# Patient Record
Sex: Male | Born: 1965 | Race: White | Hispanic: No | Smoking: Never smoker
Health system: Southern US, Community
[De-identification: ages and names within clinical notes are randomized; demographics above are authoritative.]

## PROBLEM LIST (undated history)

## (undated) DIAGNOSIS — M1990 Unspecified osteoarthritis, unspecified site: Secondary | ICD-10-CM

## (undated) DIAGNOSIS — E119 Type 2 diabetes mellitus without complications: Secondary | ICD-10-CM

## (undated) DIAGNOSIS — T7840XA Allergy, unspecified, initial encounter: Secondary | ICD-10-CM

## (undated) HISTORY — PX: SPINE SURGERY: SHX786

## (undated) HISTORY — DX: Allergy, unspecified, initial encounter: T78.40XA

## (undated) HISTORY — DX: Unspecified osteoarthritis, unspecified site: M19.90

## (undated) HISTORY — PX: BACK SURGERY: SHX140

## (undated) HISTORY — PX: KNEE ARTHROSCOPY: SUR90

---

## 1999-08-29 ENCOUNTER — Encounter: Payer: Self-pay | Admitting: Emergency Medicine

## 1999-08-29 ENCOUNTER — Emergency Department (HOSPITAL_COMMUNITY): Admission: EM | Admit: 1999-08-29 | Discharge: 1999-08-29 | Payer: Self-pay | Admitting: Emergency Medicine

## 2014-04-28 ENCOUNTER — Emergency Department (HOSPITAL_BASED_OUTPATIENT_CLINIC_OR_DEPARTMENT_OTHER): Payer: No Typology Code available for payment source

## 2014-04-28 ENCOUNTER — Emergency Department (HOSPITAL_BASED_OUTPATIENT_CLINIC_OR_DEPARTMENT_OTHER)
Admission: EM | Admit: 2014-04-28 | Discharge: 2014-04-28 | Disposition: A | Discharge status: Home / Self Care | Payer: No Typology Code available for payment source | Attending: Emergency Medicine | Admitting: Emergency Medicine

## 2014-04-28 ENCOUNTER — Encounter (HOSPITAL_BASED_OUTPATIENT_CLINIC_OR_DEPARTMENT_OTHER): Payer: Self-pay | Admitting: Emergency Medicine

## 2014-04-28 DIAGNOSIS — Z791 Long term (current) use of non-steroidal anti-inflammatories (NSAID): Secondary | ICD-10-CM | POA: Insufficient documentation

## 2014-04-28 DIAGNOSIS — Z9889 Other specified postprocedural states: Secondary | ICD-10-CM | POA: Insufficient documentation

## 2014-04-28 DIAGNOSIS — Y9389 Activity, other specified: Secondary | ICD-10-CM | POA: Insufficient documentation

## 2014-04-28 DIAGNOSIS — S46909A Unspecified injury of unspecified muscle, fascia and tendon at shoulder and upper arm level, unspecified arm, initial encounter: Secondary | ICD-10-CM | POA: Insufficient documentation

## 2014-04-28 DIAGNOSIS — Y92410 Unspecified street and highway as the place of occurrence of the external cause: Secondary | ICD-10-CM | POA: Insufficient documentation

## 2014-04-28 DIAGNOSIS — Z880 Allergy status to penicillin: Secondary | ICD-10-CM | POA: Insufficient documentation

## 2014-04-28 DIAGNOSIS — S339XXA Sprain of unspecified parts of lumbar spine and pelvis, initial encounter: Secondary | ICD-10-CM

## 2014-04-28 DIAGNOSIS — S4980XA Other specified injuries of shoulder and upper arm, unspecified arm, initial encounter: Secondary | ICD-10-CM | POA: Insufficient documentation

## 2014-04-28 DIAGNOSIS — M25519 Pain in unspecified shoulder: Secondary | ICD-10-CM

## 2014-04-28 DIAGNOSIS — S335XXA Sprain of ligaments of lumbar spine, initial encounter: Secondary | ICD-10-CM

## 2014-04-28 MED ORDER — HYDROCODONE-ACETAMINOPHEN 5-325 MG PO TABS: 1.0000 | ORAL_TABLET | Freq: Four times a day (QID) | ORAL | Status: DC | PRN

## 2014-04-28 MED ORDER — IBUPROFEN 600 MG PO TABS: 600.0000 mg | ORAL_TABLET | Freq: Four times a day (QID) | ORAL | Status: AC | PRN

## 2014-04-28 MED ORDER — HYDROCODONE-ACETAMINOPHEN 5-325 MG PO TABS
1.0000 | ORAL_TABLET | Freq: Once | ORAL | Status: AC
Start: 2014-04-28 — End: 2014-04-28
  Administered 2014-04-28: 1 via ORAL
  Filled 2014-04-28: qty 1
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
## 2015-06-30 ENCOUNTER — Ambulatory Visit (INDEPENDENT_AMBULATORY_CARE_PROVIDER_SITE_OTHER): Payer: BLUE CROSS/BLUE SHIELD | Admitting: Family Medicine

## 2015-06-30 VITALS — BP 138/82 | HR 93 | Temp 97.9°F | Resp 18 | Ht 71.75 in | Wt 223.2 lb

## 2015-06-30 DIAGNOSIS — Z0000 Encounter for general adult medical examination without abnormal findings: Secondary | ICD-10-CM | POA: Diagnosis not present

## 2015-06-30 DIAGNOSIS — Z13220 Encounter for screening for lipoid disorders: Secondary | ICD-10-CM | POA: Diagnosis not present

## 2015-06-30 DIAGNOSIS — E1165 Type 2 diabetes mellitus with hyperglycemia: Secondary | ICD-10-CM

## 2015-06-30 DIAGNOSIS — Z130 Encounter for screening for diseases of the blood and blood-forming organs and certain disorders involving the immune mechanism: Secondary | ICD-10-CM | POA: Diagnosis not present

## 2015-06-30 DIAGNOSIS — IMO0002 Reserved for concepts with insufficient information to code with codable children: Secondary | ICD-10-CM

## 2015-06-30 DIAGNOSIS — Z91030 Bee allergy status: Secondary | ICD-10-CM | POA: Diagnosis not present

## 2015-06-30 DIAGNOSIS — E663 Overweight: Secondary | ICD-10-CM

## 2015-06-30 DIAGNOSIS — Z23 Encounter for immunization: Secondary | ICD-10-CM | POA: Diagnosis not present

## 2015-06-30 DIAGNOSIS — Z131 Encounter for screening for diabetes mellitus: Secondary | ICD-10-CM | POA: Diagnosis not present

## 2015-06-30 DIAGNOSIS — E785 Hyperlipidemia, unspecified: Secondary | ICD-10-CM

## 2015-06-30 LAB — COMPREHENSIVE METABOLIC PANEL
ALK PHOS: 114 U/L (ref 39–117)
ALT: 27 U/L (ref 0–53)
AST: 16 U/L (ref 0–37)
Albumin: 4.4 g/dL (ref 3.5–5.2)
BUN: 15 mg/dL (ref 6–23)
CHLORIDE: 98 meq/L (ref 96–112)
CO2: 25 mEq/L (ref 19–32)
CREATININE: 0.87 mg/dL (ref 0.50–1.35)
Calcium: 9.6 mg/dL (ref 8.4–10.5)
Glucose, Bld: 378 mg/dL — ABNORMAL HIGH (ref 70–99)
POTASSIUM: 4.9 meq/L (ref 3.5–5.3)
SODIUM: 135 meq/L (ref 135–145)
TOTAL PROTEIN: 7.8 g/dL (ref 6.0–8.3)
Total Bilirubin: 0.4 mg/dL (ref 0.2–1.2)

## 2015-06-30 LAB — LIPID PANEL
Cholesterol: 184 mg/dL (ref 0–200)
HDL: 33 mg/dL — AB (ref >=40)
LDL CALC: 115 mg/dL — AB (ref 0–99)
Total CHOL/HDL Ratio: 5.6 Ratio
Triglycerides: 180 mg/dL — ABNORMAL HIGH (ref <150)
VLDL: 36 mg/dL (ref 0–40)

## 2015-06-30 LAB — CBC
HCT: 42.9 % (ref 39.0–52.0)
Hemoglobin: 14.6 g/dL (ref 13.0–17.0)
MCH: 29.4 pg (ref 26.0–34.0)
MCHC: 34 g/dL (ref 30.0–36.0)
MCV: 86.5 fL (ref 78.0–100.0)
MPV: 11.8 fL (ref 8.6–12.4)
Platelets: 212 10*3/uL (ref 150–400)
RBC: 4.96 MIL/uL (ref 4.22–5.81)
RDW: 12.1 % (ref 11.5–15.5)
WBC: 6.5 10*3/uL (ref 4.0–10.5)

## 2015-06-30 LAB — HEMOGLOBIN A1C
Hgb A1c MFr Bld: 11.7 % — ABNORMAL HIGH (ref <5.7)
Mean Plasma Glucose: 289 mg/dL — ABNORMAL HIGH (ref <117)

## 2015-06-30 MED ORDER — EPINEPHRINE 0.3 MG/0.3ML IJ SOAJ: 0.3000 mg | Freq: Once | INTRAMUSCULAR | Status: AC

## 2015-06-30 NOTE — Progress Notes (Addendum)
[facility] [address] [place] [address] [phone]  Date:  06/30/2015   Name:  Evan Wilkins   DOB:  [birth year]   MRN:  [id number]  PCP:  PROVIDER NOT IN SYSTEM    Chief Complaint: Annual Exam   History of Present Illness:  Evan Wilkins is a 49 y.o. very pleasant male patient who presents with the following:  Needs a CPE .  He is generally in good health He has had some knee operations, and a spinal fusion L4- S1.   Never a smoker He does exercise- goes to the gym, enjoys lifting weights and cardio He works in Air[employer].  He needs an epipen - bee allergy He is fasting today for labs  He thinks he needs a tetanus shot  He is divorced, has 2 grown daughters and a 57 year old son at home  There are no active problems to display for this patient.   Past Medical History  Diagnosis Date  . Allergy   . Arthritis     Past Surgical History  Procedure Laterality Date  . Back surgery    . Knee arthroscopy    . Spine surgery      History  Substance Use Topics  . Smoking status: Never Smoker   . Smokeless tobacco: Not on file  . Alcohol Use: No    Family History  Problem Relation Age of Onset  . Diabetes Father   . Heart disease Father   . Hypertension Father     Allergies  Allergen Reactions  . Amoxicillin     Medication list has been reviewed and updated.  Current Outpatient Prescriptions on File Prior to Visit  Medication Sig Dispense Refill  . ibuprofen (ADVIL,MOTRIN) 600 MG tablet Take 1 tablet (600 mg total) by mouth every 6 (six) hours as needed. 30 tablet 0   No current facility-administered medications on file prior to visit.    Review of Systems:  As per HPI- otherwise negative.   Physical Examination: Filed Vitals:   06/30/15 0827  BP: 138/82  Pulse: 93  Temp: 97.9 F (36.6 C)  Resp: 18   Filed Vitals:   06/30/15 0827  Height: 5' 11.75" (1.822 m)  Weight: 223 lb 4 oz (101.266 kg)   Body mass index  is 30.5 kg/(m^2). Ideal Body Weight: Weight in (lb) to have BMI = 25: 182.7  GEN: WDWN, NAD, Non-toxic, A & O x 3, mild overweight, looks well HEENT: Atraumatic, Normocephalic. Neck supple. No masses, No LAD.  Bilateral TM wnl, oropharynx normal.  PEERL,EOMI.   Ears and Nose: No external deformity. CV: RRR, No M/G/R. No JVD. No thrill. No extra heart sounds. PULM: CTA B, no wheezes, crackles, rhonchi. No retractions. No resp. distress. No accessory muscle use. ABD: S, NT, ND. No rebound. No HSM. EXTR: No c/c/e NEURO Normal gait.  PSYCH: Normally interactive. Conversant. Not depressed or anxious appearing.  Calm demeanor.  No testicular masses or penile discharge   Assessment and Plan: Physical exam  Bee allergy status - Plan: EPINEPHrine (EPIPEN 2-PAK) 0.3 mg/0.3 mL IJ SOAJ injection  Screening for diabetes mellitus - Plan: Comprehensive metabolic panel, Hemoglobin A1c  Screening for deficiency anemia - Plan: CBC  Screening for hyperlipidemia - Plan: Lipid panel  Immunization due - Plan: Tdap vaccine greater than or equal to 7yo IM  Generally healthy, encouraged him to try and drop some weight He will work on this, await labs and will follow-up  with him  Signed Abbe AmsterdamJessica Seymour Pavlak, MD  Called 7/11 to go over his labs.  Let him know that he does have DM.  We will start cholesterol medication and metformin, build up to 1,000 BID over a month Discussed diet and exercise, suggested the ADA website for lot of good information Plan to recheck in 2 months He is not having any sx of DM, but he does have a family history (his dad) Welcomed and answered all questions  Results for orders placed or performed in visit on 06/30/15  CBC  Result Value Ref Range   WBC 6.5 4.0 - 10.5 K/uL   RBC 4.96 4.22 - 5.81 MIL/uL   Hemoglobin 14.6 13.0 - 17.0 g/dL   HCT 16.142.9 09.639.0 - 04.552.0 %   MCV 86.5 78.0 - 100.0 fL   MCH 29.4 26.0 - 34.0 pg   MCHC 34.0 30.0 - 36.0 g/dL   RDW 40.912.1 81.111.5 - 91.415.5 %    Platelets 212 150 - 400 K/uL   MPV 11.8 8.6 - 12.4 fL  Comprehensive metabolic panel  Result Value Ref Range   Sodium 135 135 - 145 mEq/L   Potassium 4.9 3.5 - 5.3 mEq/L   Chloride 98 96 - 112 mEq/L   CO2 25 19 - 32 mEq/L   Glucose, Bld 378 (H) 70 - 99 mg/dL   BUN 15 6 - 23 mg/dL   Creat 7.820.87 9.560.50 - 2.131.35 mg/dL   Total Bilirubin 0.4 0.2 - 1.2 mg/dL   Alkaline Phosphatase 114 39 - 117 U/L   AST 16 0 - 37 U/L   ALT 27 0 - 53 U/L   Total Protein 7.8 6.0 - 8.3 g/dL   Albumin 4.4 3.5 - 5.2 g/dL   Calcium 9.6 8.4 - 08.610.5 mg/dL  Lipid panel  Result Value Ref Range   Cholesterol 184 0 - 200 mg/dL   Triglycerides 578180 (H) <150 mg/dL   HDL 33 (L) >=46>=40 mg/dL   Total CHOL/HDL Ratio 5.6 Ratio   VLDL 36 0 - 40 mg/dL   LDL Cholesterol 962115 (H) 0 - 99 mg/dL  Hemoglobin X5MA1c  Result Value Ref Range   Hgb A1c MFr Bld 11.7 (H) <5.7 %   Mean Plasma Glucose 289 (H) <117 mg/dL

## 2015-06-30 NOTE — Patient Instructions (Signed)
Good to see you today I will be in touch with your labs- we will send a letter or release to you on mychart You got your "tdap" vaccine today which is a tetanus and whooping cough booster I would recommend that you try to lose a few pounds - weight tends to sneak up on us with age

## 2015-07-02 ENCOUNTER — Encounter: Payer: Self-pay | Admitting: Family Medicine

## 2015-07-02 DIAGNOSIS — E1165 Type 2 diabetes mellitus with hyperglycemia: Secondary | ICD-10-CM | POA: Insufficient documentation

## 2015-07-02 DIAGNOSIS — IMO0002 Reserved for concepts with insufficient information to code with codable children: Secondary | ICD-10-CM | POA: Insufficient documentation

## 2015-07-02 DIAGNOSIS — E785 Hyperlipidemia, unspecified: Secondary | ICD-10-CM | POA: Insufficient documentation

## 2015-07-02 MED ORDER — PRAVASTATIN SODIUM 40 MG PO TABS
40.0000 mg | ORAL_TABLET | Freq: Every day | ORAL | Status: DC
Start: 2015-07-02 — End: 2015-10-03

## 2015-07-02 MED ORDER — METFORMIN HCL 500 MG PO TABS: ORAL_TABLET | ORAL | Status: DC

## 2015-07-02 NOTE — Addendum Note (Signed)
Addended by: Abbe AmsterdamOPLAND, JESSICA C on: 07/02/2015 09:03 AM   Modules accepted: Orders

## 2015-09-19 ENCOUNTER — Ambulatory Visit: Payer: BLUE CROSS/BLUE SHIELD | Admitting: Family Medicine

## 2015-10-03 ENCOUNTER — Encounter: Payer: Self-pay | Admitting: Family Medicine

## 2015-10-03 ENCOUNTER — Ambulatory Visit (INDEPENDENT_AMBULATORY_CARE_PROVIDER_SITE_OTHER): Payer: BLUE CROSS/BLUE SHIELD | Admitting: Family Medicine

## 2015-10-03 VITALS — BP 126/80 | HR 89 | Temp 98.0°F | Resp 16 | Wt 219.4 lb

## 2015-10-03 DIAGNOSIS — Z23 Encounter for immunization: Secondary | ICD-10-CM | POA: Diagnosis not present

## 2015-10-03 DIAGNOSIS — E785 Hyperlipidemia, unspecified: Secondary | ICD-10-CM

## 2015-10-03 DIAGNOSIS — E1165 Type 2 diabetes mellitus with hyperglycemia: Secondary | ICD-10-CM

## 2015-10-03 DIAGNOSIS — IMO0001 Reserved for inherently not codable concepts without codable children: Secondary | ICD-10-CM

## 2015-10-03 LAB — LIPID PANEL
CHOL/HDL RATIO: 3.2 ratio (ref <=5.0)
CHOLESTEROL: 100 mg/dL — AB (ref 125–200)
HDL: 31 mg/dL — AB (ref >=40)
LDL Cholesterol: 49 mg/dL (ref <130)
Triglycerides: 102 mg/dL (ref <150)
VLDL: 20 mg/dL (ref <30)

## 2015-10-03 LAB — MICROALBUMIN, URINE: Microalb, Ur: 0.4 mg/dL (ref <2.0)

## 2015-10-03 LAB — HEMOGLOBIN A1C
Hgb A1c MFr Bld: 7.6 % — ABNORMAL HIGH (ref <5.7)
Mean Plasma Glucose: 171 mg/dL — ABNORMAL HIGH (ref <117)

## 2015-10-03 MED ORDER — PRAVASTATIN SODIUM 40 MG PO TABS: 40.0000 mg | ORAL_TABLET | Freq: Every day | ORAL | Status: AC

## 2015-10-03 MED ORDER — METFORMIN HCL 1000 MG PO TABS: ORAL_TABLET | ORAL | Status: AC

## 2015-10-03 NOTE — Progress Notes (Addendum)
[facility] [address], [place] [id number] [phone]  Date:  10/03/2015   Name:  Evan Wilkins   DOB:  1966-07-18   MRN:  [id number]  PCP:  PROVIDER NOT IN SYSTEM    Chief Complaint: Diabetes   History of Present Illness:  Evan Wilkins:  Here today to recheck DM.  I saw him in July for a CPE, and we diagnosed DM as below. We started medication for cholesterol and metformin as well He is tolerating the medications well He is checking his glucose at home sometimes- his last glucose check was 166  Wt Readings from Last 3 Encounters:  10/03/15 219 lb 6.4 oz (99.519 kg)  06/30/15 223 lb 4 oz (101.266 kg)  04/28/14 235 lb (106.595 kg)   He has lost weight- he now weighs 217 at home He is fasting today He got his flu shot today, pneumonia shot today  Lab Results  Component Value Date   HGBA1C 11.7* 06/30/2015     Wilkins Active Problem List   Diagnosis Date Noted  . Dyslipidemia 07/02/2015  . Diabetes mellitus type 2, uncontrolled (HCC) 07/02/2015  . Bee allergy status 06/30/2015  . Overweight 06/30/2015    Past Medical History  Diagnosis Date  . Allergy   . Arthritis     Past Surgical History  Procedure Laterality Date  . Back surgery    . Knee arthroscopy    . Spine surgery      Social History  Substance Use Topics  . Smoking status: Never Smoker   . Smokeless tobacco: None  . Alcohol Use: No    Family History  Problem Relation Age of Onset  . Diabetes Father   . Heart disease Father   . Hypertension Father     Allergies  Allergen Reactions  . Amoxicillin     Medication list has been reviewed and updated.  Current Outpatient Prescriptions on File Prior to Visit  Medication Sig Dispense Refill  . EPINEPHrine (EPIPEN 2-PAK) 0.3 mg/0.3 mL IJ SOAJ injection Inject 0.3 mLs (0.3 mg total) into the muscle once. 2 Device 2  . ibuprofen  (ADVIL,MOTRIN) 600 MG tablet Take 1 tablet (600 mg total) by mouth every 6 (six) hours as needed. 30 tablet 0  . metFORMIN (GLUCOPHAGE) 500 MG tablet Start with 1 at bedtime for one week,  Build to 2 pills twice a day over one month 120 tablet 3  . pravastatin (PRAVACHOL) 40 MG tablet Take 1 tablet (40 mg total) by mouth daily. 30 tablet 3   No current facility-administered medications on file prior to visit.    Review of Systems:  As per HPI- otherwise negative.   Physical Examination: Filed Vitals:   10/03/15 0923  BP: 126/80  Pulse:   Temp:   Resp:    Filed Vitals:   10/03/15 0905  Weight: 219 lb 6.4 oz (99.519 kg)   Body mass index is 29.98 kg/(m^2). Ideal Body Weight:    GEN: WDWN, NAD, Non-toxic, A & O x 3, looks well, overweight but has lost HEENT: Atraumatic, Normocephalic. Neck supple. No masses, No LAD. Ears and Nose: No external deformity. CV: RRR, No M/G/R. No JVD. No thrill. No extra heart sounds. PULM: CTA B, no wheezes, crackles, rhonchi. No retractions. No resp. distress. No accessory muscle use.Marland Kitchen. EXTR: No c/c/e NEURO Normal gait.  PSYCH: Normally interactive. Conversant. Not depressed or  anxious appearing.  Calm demeanor.    Assessment and Plan: Uncontrolled type 2 diabetes mellitus without complication, without long-term current use of insulin (HCC) - Plan: Hemoglobin A1c, Microalbumin, urine, metFORMIN (GLUCOPHAGE) 1000 MG tablet  Need for prophylactic vaccination and inoculation against influenza - Plan: Flu Vaccine QUAD 36+ mos IM  Immunization due - Plan: Pneumococcal polysaccharide vaccine 23-valent greater than or equal to 2yo subcutaneous/IM  Dyslipidemia - Plan: Lipid panel, pravastatin (PRAVACHOL) 40 MG tablet flu shot, pneumovax today due to DM Await labs He was recently dx with DM and has been working hard on lifestyle changes- hope to see good progress when his labs come in    Signed Abbe Amsterdam, MD  Received his labs as below- he  is doing much better.  Called to let him know, will mail a copy of his labs  Results for orders placed or performed in visit on 10/03/15  Lipid panel  Result Value Ref Range   Cholesterol 100 (L) 125 - 200 mg/dL   Triglycerides 454 <098 mg/dL   HDL 31 (L) >=11 mg/dL   Total CHOL/HDL Ratio 3.2 <=5.0 Ratio   VLDL 20 <30 mg/dL   LDL Cholesterol 49 <914 mg/dL  Hemoglobin N8G  Result Value Ref Range   Hgb A1c MFr Bld 7.6 (H) <5.7 %   Mean Plasma Glucose 171 (H) <117 mg/dL  Microalbumin, urine  Result Value Ref Range   Microalb, Ur 0.4 <2.0 mg/dL

## 2015-10-03 NOTE — Patient Instructions (Signed)
Good to see you today!  I will be in touch with your labs asap I hope that we will see good improvement of your A1c.  Continue to work on diet and weight loss, but you are making great progress so far You got your pneumonia shot today- you do not need another until age 49 Also, if you have not already I would recommend that you have an eye exam- this is important for diabetics  As a diabetic, there are several things you can do to monitor your condition and maintain your health.  1. Check your feet daily for any skin breakdown 2. Exercise and keep track of your diet 3. Let us know before you run out of your medications 4. Get your annual flu shot, and ask if you need a pneumonia shot 5. Ask if you are up to date on your labs; you should have an A1c every 6 months, a urine protein test annually, and a cholesterol test annually.  Your doctor may decide to do labs more often if indicated 6. Take off your shoes and socks at each visit.  Be sure your doctor examines your feet.   7. Ask about your blood pressure.  Your goal is 130/ 80 or less 8. Get an annual eye exam.  Please ask your ophthalmologist to send us your report 9. Keep up with your dental cleanings and exams.

## 2016-05-18 ENCOUNTER — Emergency Department (HOSPITAL_COMMUNITY)
Admission: EM | Admit: 2016-05-18 | Discharge: 2016-05-19 | Disposition: A | Discharge status: Home / Self Care | Payer: BLUE CROSS/BLUE SHIELD | Attending: Emergency Medicine | Admitting: Emergency Medicine

## 2016-05-18 ENCOUNTER — Encounter (HOSPITAL_COMMUNITY): Payer: Self-pay | Admitting: Emergency Medicine

## 2016-05-18 DIAGNOSIS — E119 Type 2 diabetes mellitus without complications: Secondary | ICD-10-CM | POA: Insufficient documentation

## 2016-05-18 DIAGNOSIS — F4321 Adjustment disorder with depressed mood: Secondary | ICD-10-CM | POA: Diagnosis not present

## 2016-05-18 DIAGNOSIS — Z7984 Long term (current) use of oral hypoglycemic drugs: Secondary | ICD-10-CM | POA: Diagnosis not present

## 2016-05-18 DIAGNOSIS — F439 Reaction to severe stress, unspecified: Secondary | ICD-10-CM

## 2016-05-18 DIAGNOSIS — M1990 Unspecified osteoarthritis, unspecified site: Secondary | ICD-10-CM | POA: Insufficient documentation

## 2016-05-18 DIAGNOSIS — R45851 Suicidal ideations: Secondary | ICD-10-CM | POA: Diagnosis present

## 2016-05-18 HISTORY — DX: Type 2 diabetes mellitus without complications: E11.9

## 2016-05-18 LAB — CBC
HCT: 41.5 % (ref 39.0–52.0)
HEMOGLOBIN: 14.5 g/dL (ref 13.0–17.0)
MCH: 29.8 pg (ref 26.0–34.0)
MCHC: 34.9 g/dL (ref 30.0–36.0)
MCV: 85.4 fL (ref 78.0–100.0)
Platelets: 222 10*3/uL (ref 150–400)
RBC: 4.86 MIL/uL (ref 4.22–5.81)
RDW: 12.5 % (ref 11.5–15.5)
WBC: 8.5 10*3/uL (ref 4.0–10.5)

## 2016-05-18 LAB — COMPREHENSIVE METABOLIC PANEL
ALBUMIN: 4.7 g/dL (ref 3.5–5.0)
ALK PHOS: 84 U/L (ref 38–126)
ALT: 29 U/L (ref 17–63)
AST: 19 U/L (ref 15–41)
Anion gap: 8 (ref 5–15)
BILIRUBIN TOTAL: 0.3 mg/dL (ref 0.3–1.2)
BUN: 15 mg/dL (ref 6–20)
CALCIUM: 10.5 mg/dL — AB (ref 8.9–10.3)
CO2: 28 mmol/L (ref 22–32)
Chloride: 102 mmol/L (ref 101–111)
Creatinine, Ser: 1.07 mg/dL (ref 0.61–1.24)
GFR calc Af Amer: >60 mL/min (ref >60)
GFR calc non Af Amer: >60 mL/min (ref >60)
GLUCOSE: 237 mg/dL — AB (ref 65–99)
Potassium: 4.6 mmol/L (ref 3.5–5.1)
SODIUM: 138 mmol/L (ref 135–145)
TOTAL PROTEIN: 8.1 g/dL (ref 6.5–8.1)

## 2016-05-18 LAB — ACETAMINOPHEN LEVEL: Acetaminophen (Tylenol), Serum: <10 ug/mL — ABNORMAL LOW (ref 10–30)

## 2016-05-18 LAB — SALICYLATE LEVEL: Salicylate Lvl: <4 mg/dL (ref 2.8–30.0)

## 2016-05-18 LAB — ETHANOL: Alcohol, Ethyl (B): <5 mg/dL (ref <5)

## 2016-05-18 NOTE — ED Notes (Signed)
Pt showed up to his daughters work today and discussed his stressors with her.  Per daughter pt stated he wanted to kill himself.  Per pt he stated, "I'll have to find other ways to deal with it". Denies SI/HI.

## 2016-05-18 NOTE — ED Notes (Signed)
Pt scrubbed, wanded and clothes put in two bags.

## 2016-05-18 NOTE — ED Notes (Signed)
Pt dressed into paper scrubs.  Security aware of the need for patient to be wanded. All belongings placed in bags and secured at the nurse's station.

## 2016-05-18 NOTE — ED Notes (Signed)
According to IVC papers, daughter stated that her father had a gun and showed up to her job and told her he loved her and appreciated her but was tired of fighting and he would not be here by the end of the week.

## 2016-05-19 DIAGNOSIS — F4321 Adjustment disorder with depressed mood: Secondary | ICD-10-CM

## 2016-05-19 LAB — URINALYSIS, ROUTINE W REFLEX MICROSCOPIC
Bilirubin Urine: NEGATIVE
Glucose, UA: 250 mg/dL — AB
Hgb urine dipstick: NEGATIVE
Ketones, ur: NEGATIVE mg/dL
LEUKOCYTES UA: NEGATIVE
NITRITE: NEGATIVE
PH: 5.5 (ref 5.0–8.0)
Protein, ur: NEGATIVE mg/dL
SPECIFIC GRAVITY, URINE: 1.014 (ref 1.005–1.030)

## 2016-05-19 LAB — RAPID URINE DRUG SCREEN, HOSP PERFORMED
AMPHETAMINES: NOT DETECTED
Barbiturates: NOT DETECTED
Benzodiazepines: NOT DETECTED
Cocaine: NOT DETECTED
OPIATES: NOT DETECTED
Tetrahydrocannabinol: NOT DETECTED

## 2016-05-19 MED ORDER — METFORMIN HCL 500 MG PO TABS
1000.0000 mg | ORAL_TABLET | Freq: Once | ORAL | Status: AC
  Administered 2016-05-19: 1000 mg via ORAL
  Filled 2016-05-19: qty 2

## 2016-05-19 NOTE — ED Notes (Signed)
Pt discharged home per MD order. Discharge summary reviewed with outpatient resources. Pt verbalizes understanding, pleasant. Pt denies SI/HI. Denies AVH at discharge. Pt denies physical pain.Pt signed for personal belongings and personal belongings given to pt. Pt ambulatory off unit.

## 2016-05-19 NOTE — BH Assessment (Signed)
[facility] Assessment Progress Note  Per Thedore MinsMojeed Akintayo, MD, this pt does not require psychiatric hospitalization at this time.  He presents under IVC initiated by his daughter.  Pt is to be released from IVC and [facility].  IVC has been rescinded.  Please note that area practices are closed today for the [facility] holiday, making it impractical for appointments to be scheduled on his behalf.  Discharge instructions advise pt to contact the [facility], or [facility] at his own initiative to schedule an intake appointment.  Pt's nurse, Morrie Sheldonshley, has been notified.  Evan Canninghomas Nakia Koble, MA Triage Specialist [phone]

## 2016-05-19 NOTE — BH Assessment (Addendum)
Tele Assessment Note   Evan Wilkins is an 50 y.o. male.  -Clinician reviewed note by Evan Rud, PA.  Patient is brought in on IVC taken out by daughter.  IVC papers report patient went to daughter's worksite and complained of "being tired of fighting" and would not be here at the end of the week.  IVC papers say that patient has a gun.  Patient denies any SI, intention or plan.  He says he has no gun either.  Patient denies any pervasive depression.  Patient does admit to a stressor of having his car not working properly.  He says he has some financial pressures.  He says "I am not going to hurt myself because of finances, that is not a reason to end your life."  "I have a job and want to continue to work."  Patient denies any previous psychiatric history.  Patient denies any HI or A/V hallucinations.  Pt also denies any ETOH or illicit drug use, his UDS is clear.  Clinician did talk to his daughter.  She said that patient came her workplace Evan Wilkins) and told her he loved her and that he did not want to be around at the end of the week.  She claims that he told her he had a gun.  He reportedly told her that he did not want people saying that they care for him at his funeral when they won't say anything nice to him in person.  Daughter also reports that patient told her he has brain cancer and got the diagnosis a week ago.  Clinician did ask patient about whether he had any recent negative health problems.  Patient hesitated and said that about two weeks ago he had been dx'ed with a possible brain tumor.  He says however that he still did not want to kill himself.  He says, "that is all in God's hands and he can heal me if I am meant to be healed but I don't want to harm myself in the meantime."  Patient talked about his children and his girlfriend and her children as reasons not to harm self.  -Clinician discussed patient care with Evan Morn, PA.  He recommended patient stay until psychiatry  can uphold or rescind IVC paperwork.  Clinician discussed this with Evan Rud, PA and she said that was fine with her if Evan Wilkins thought patient should stay.  Diagnosis: Depression, single episode  Past Medical History:  Past Medical History  Diagnosis Date  . Allergy   . Arthritis   . Diabetes mellitus without complication [facility])     Past Surgical History  Procedure Laterality Date  . Back surgery    . Knee arthroscopy    . Spine surgery      Family History:  Family History  Problem Relation Age of Onset  . Diabetes Father   . Heart disease Father   . Hypertension Father     Social History:  reports that he has never smoked. He has never used smokeless tobacco. He reports that he does not drink alcohol or use illicit drugs.  Additional Social History:  Alcohol / Drug Use Prescriptions: Pravistatin & metformin Over the Counter: Alleve for back pain PRN History of alcohol / drug use?: No history of alcohol / drug abuse  CIWA: CIWA-Ar BP: 168/90 mmHg Pulse Rate: 97 COWS:    PATIENT STRENGTHS: (choose at least two) Ability for insight Average or above average intelligence Capable of independent living Communication skills Supportive family/friends  Allergies:  Allergies  Allergen Reactions  . Amoxicillin     Home Medications:  (Not in a hospital admission)  OB/GYN Status:  No LMP for male patient.  General Assessment Data Location of Assessment: [facility] TTS Assessment: In system Is this a Tele or Face-to-Face Assessment?: Face-to-Face Is this an Initial Assessment or a Re-assessment for this encounter?: Initial Assessment Marital status: Single Is patient pregnant?: No Pregnancy Status: No Living Arrangements: Parent Can pt return to current living arrangement?: Yes Admission Status: Involuntary Is patient capable of signing voluntary admission?: No Referral Source: Self/Family/Friend Insurance type: BC/BS     Crisis Care Plan Living  Arrangements: Parent Name of Psychiatrist: None Name of Therapist: None  Education Status Is patient currently in school?: No Highest grade of school patient has completed: 12th grade  Risk to self with the past 6 months Suicidal Ideation: No Has patient been a risk to self within the past 6 months prior to admission? : No Suicidal Intent: No Has patient had any suicidal intent within the past 6 months prior to admission? : No Is patient at risk for suicide?: No Suicidal Plan?: No Has patient had any suicidal plan within the past 6 months prior to admission? : No Access to Means: No (Pt says he does not own a gun.) What has been your use of drugs/alcohol within the last 12 months?: Denies.  UDS is clear' Previous Attempts/Gestures: No How many times?: 0 Other Self Harm Risks: None Triggers for Past Attempts: None known Intentional Self Injurious Behavior: None Family Suicide History: No Recent stressful life event(s): Financial Problems (Car broken down.) Persecutory voices/beliefs?: No Depression: No Depression Symptoms:  (Pt denies any depressive symptoms.) Substance abuse history and/or treatment for substance abuse?: No Suicide prevention information given to non-admitted patients: Not applicable  Risk to Others within the past 6 months Homicidal Ideation: No Does patient have any lifetime risk of violence toward others beyond the six months prior to admission? : No Thoughts of Harm to Others: No Current Homicidal Intent: No Current Homicidal Plan: No Access to Homicidal Means: No Identified Victim: None  History of harm to others?: No Assessment of Violence: None Noted Violent Behavior Description: None Does patient have access to weapons?: No (Pt states he has no guns.) Criminal Charges Pending?: No Does patient have a court date: No Is patient on probation?: No  Psychosis Hallucinations: None noted Delusions: None noted  Mental Status  Report Appearance/Hygiene: Unremarkable, In scrubs Eye Contact: Good Motor Activity: Freedom of movement Speech: Incoherent Level of Consciousness: Alert Mood: Pleasant Affect: Appropriate to circumstance Anxiety Level: Minimal Thought Processes: Coherent, Relevant Judgement: Unimpaired Orientation: Person, Place, Time, Situation Obsessive Compulsive Thoughts/Behaviors: None  Cognitive Functioning Concentration: Normal Memory: Recent Intact, Remote Intact IQ: Average Insight: Good Impulse Control: Good Appetite: Good Weight Loss: 0 Weight Gain: 0 Sleep: No Change Total Hours of Sleep: 7 Vegetative Symptoms: None  ADLScreening [facility] Assessment Services) Patient's cognitive ability adequate to safely complete daily activities?: Yes Patient able to express need for assistance with ADLs?: Yes Independently performs ADLs?: Yes (appropriate for developmental age)  Prior Inpatient Therapy Prior Inpatient Therapy: No Prior Therapy Dates: None Prior Therapy Facilty/Provider(s): None Reason for Treatment: None  Prior Outpatient Therapy Prior Outpatient Therapy: No Prior Therapy Dates: N/A Prior Therapy Facilty/Provider(s): N/A Reason for Treatment: N/a Does patient have an ACCT team?: No Does patient have Intensive In-House Services?  : No Does patient have [facility] services? : No Does patient have P4CC services?: No  ADL Screening (condition at time of admission) Patient's cognitive ability adequate to safely complete daily activities?: Yes Is the patient deaf or have difficulty hearing?: No Does the patient have difficulty seeing, even when wearing glasses/contacts?: No Does the patient have difficulty concentrating, remembering, or making decisions?: No Patient able to express need for assistance with ADLs?: Yes Does the patient have difficulty dressing or bathing?: Yes Independently performs ADLs?: Yes (appropriate for developmental age) Does the patient have  difficulty walking or climbing stairs?: No Weakness of Legs: None Weakness of Arms/Hands: None       Abuse/Neglect Assessment (Assessment to be complete while patient is alone) Physical Abuse: Denies Verbal Abuse: Denies Sexual Abuse: Denies     Advance Directives (For Healthcare) Does patient have an advance directive?: No Would patient like information on creating an advanced directive?: No - patient declined information    Additional Information 1:1 In Past 12 Months?: No CIRT Risk: No Elopement Risk: No Does patient have medical clearance?: No     Disposition:  Disposition Initial Assessment Completed for this Encounter: Yes Disposition of Patient: Other dispositions Other disposition(s): Other (Comment) (Pt to be reviewed with PA)  Beatriz StallionHarvey, Verla Bryngelson Ray 05/19/2016 1:29 AM

## 2016-05-19 NOTE — ED Notes (Signed)
TTS Evan Wilkins at bedside eval pt at present. 

## 2016-05-19 NOTE — ED Notes (Signed)
Pt brought in IVC by [employer] reports daughter misunderstood a conversation they had, stating he wanted to kill himself, end it all.  Pt denies and states he was venting to pt.  Denies feeling hopeless.  Denies SI, HI or AVH.  Pt is Diabetic, denies previous psych history.  AAO x 3, no distress noted, cooperative but anxious.  Monitoring for safety, Q 15 min checks in effect.

## 2016-05-19 NOTE — [facility] Suicide Risk Assessment (Signed)
Suicide Risk Assessment  Discharge Assessment   [facility] Assessment   Principal Problem: Adjustment disorder with depressed mood Discharge Diagnoses:  Patient Active Problem List   Diagnosis Date Noted  . Adjustment disorder with depressed mood [F43.21] 05/19/2016    Priority: High  . Dyslipidemia [E78.5] 07/02/2015  . Diabetes mellitus type 2, uncontrolled (HCC) [E11.65] 07/02/2015  . Bee allergy status [Z91.030] 06/30/2015  . Overweight [E66.3] 06/30/2015    Total Time spent with patient: 45 minutes  Musculoskeletal: Strength & Muscle Tone: within normal limits Gait & Station: normal Patient leans: N/A  Psychiatric Specialty Exam: Physical Exam  Constitutional: He is oriented to person, place, and time. He appears well-developed and well-nourished.  HENT:  Head: Normocephalic.  Neck: Normal range of motion.  Respiratory: Effort normal.  Musculoskeletal: Normal range of motion.  Neurological: He is alert and oriented to person, place, and time.  Skin: Skin is warm and dry.  Psychiatric: He has a normal mood and affect. His speech is normal and behavior is normal. Judgment and thought content normal. Cognition and memory are normal.    Review of Systems  Constitutional: Negative.   HENT: Negative.   Eyes: Negative.   Respiratory: Negative.   Cardiovascular: Negative.   Gastrointestinal: Negative.   Genitourinary: Negative.   Musculoskeletal: Negative.   Skin: Negative.   Neurological: Negative.   Endo/Heme/Allergies: Negative.   Psychiatric/Behavioral: Negative.     Blood pressure 139/82, pulse 81, temperature 97.6 F (36.4 C), temperature source Oral, resp. rate 16, height 6' (1.829 m), weight 102.059 kg (225 lb), SpO2 100 %.Body mass index is 30.51 kg/(m^2).  General Appearance: Casual  Eye Contact:  Good  Speech:  Normal Rate  Volume:  Normal  Mood:  Euthymic  Affect:  Congruent  Thought Process:  Coherent  Orientation:  Full (Time,  Place, and Person)  Thought Content:  WDL  Suicidal Thoughts:  No  Homicidal Thoughts:  No  Memory:  Immediate;   Good Recent;   Good Remote;   Good  Judgement:  Fair  Insight:  Good  Psychomotor Activity:  Normal  Concentration:  Concentration: Good and Attention Span: Good  Recall:  Good  Fund of Knowledge:  Good  Language:  Good  Akathisia:  No  Handed:  Right  AIMS (if indicated):     Assets:  Communication Skills Desire for Improvement Financial Resources/Insurance Housing Leisure Time Physical Health Resilience Social Support Talents/Skills Transportation Vocational/Educational  ADL's:  Intact  Cognition:  WNL  Sleep:       Mental Status Per Nursing Assessment::   On Admission:   IVC for depression and suicidal ideations  Demographic Factors:  Male and Caucasian  Loss Factors: NA  Historical Factors: NA  Risk Reduction Factors:   Sense of responsibility to family, Employed, Living with another person, especially a relative, Positive social support and Positive coping skills or problem solving skills  Continued Clinical Symptoms:  None  Cognitive Features That Contribute To Risk:  None    Suicide Risk:  Minimal: No identifiable suicidal ideation.  Patients presenting with no risk factors but with morbid ruminations; may be classified as minimal risk based on the severity of the depressive symptoms    Plan Of Care/Follow-up recommendations:  Activity:  as tolerated Diet:  heart healthy diet  Evan Duchemin, NP 05/19/2016, 11:43 AM

## 2016-05-19 NOTE — Discharge Instructions (Signed)
For your ongoing behavioral health needs, you are advised to follow up with outpatient psychiatry and counseling.  The following practices provide these services.  Contact them at your earliest opportunity to ask about scheduling an intake appointment:       [facility]      [address]      [place], [place] [address]      [phone]       [facility]      [facility]      [address]      [place], [place] [address]      [phone]       [facility]      [address]      [place], [place] [address]      [phone]

## 2016-05-19 NOTE — ED Provider Notes (Signed)
CSN: [id number]     Arrival date & time 05/18/16  2250 History   First MD Initiated Contact with Patient 05/19/16 0015     Chief Complaint  Patient presents with  . Suicidal   HPI  Evan Wilkins is a 50 year old male with PMHx of DM presenting with concern for SI. Pt states he has been under a lot of stress recently related to job and financial stressors. Pt states he went to his daughter's work today to "vent about my problems". He states he told her that his life stressors were adding up and he was "tired of it all". Pt states that his daughter took this as he was planning to commit suicide and she called the police to have him brought in for evaluation. Pt's daughter informed the police that he has a gun at home. Pt denies this. Pt admits to feeling stressed but denies SI or HI. Denies history of SI or HI. States he occasionally has "a little bit of depression" but has never followed with a psychiatrist. He denies all physical complaints. Requests his night dose of metformin.   Past Medical History  Diagnosis Date  . Allergy   . Arthritis   . Diabetes mellitus without complication [facility])    Past Surgical History  Procedure Laterality Date  . Back surgery    . Knee arthroscopy    . Spine surgery     Family History  Problem Relation Age of Onset  . Diabetes Father   . Heart disease Father   . Hypertension Father    Social History  Substance Use Topics  . Smoking status: Never Smoker   . Smokeless tobacco: Never Used  . Alcohol Use: No    Review of Systems  All other systems reviewed and are negative.     Allergies  Amoxicillin  Home Medications   Prior to Admission medications   Medication Sig Start Date End Date Taking? Authorizing Provider  EPINEPHrine (EPIPEN 2-PAK) 0.3 mg/0.3 mL IJ SOAJ injection Inject 0.3 mLs (0.3 mg total) into the muscle once. 06/30/15  Yes Gwenlyn Found Copland, MD  ibuprofen (ADVIL,MOTRIN) 600 MG tablet Take 1 tablet (600 mg total) by mouth every 6  (six) hours as needed. 04/28/14  Yes Shon Baton, MD  metFORMIN (GLUCOPHAGE) 1000 MG tablet Take 1 twice a day 10/03/15  Yes Gwenlyn Found Copland, MD  pravastatin (PRAVACHOL) 40 MG tablet Take 1 tablet (40 mg total) by mouth daily. 10/03/15  Yes Jessica C Copland, MD   BP 168/90 mmHg  Pulse 97  Temp(Src) 98.6 F (37 C) (Oral)  Resp 16  Ht 6' (1.829 m)  Wt 102.059 kg  BMI 30.51 kg/m2  SpO2 98% Physical Exam  Constitutional: He is oriented to person, place, and time. He appears well-developed and well-nourished. No distress.  HENT:  Head: Normocephalic and atraumatic.  Eyes: Conjunctivae are normal. Right eye exhibits no discharge. Left eye exhibits no discharge. No scleral icterus.  Neck: Normal range of motion. Neck supple.  Cardiovascular: Normal rate, regular rhythm and normal heart sounds.   Pulmonary/Chest: Effort normal and breath sounds normal. No respiratory distress.  Abdominal: Soft. He exhibits no distension. There is no tenderness.  Musculoskeletal: Normal range of motion.  Neurological: He is alert and oriented to person, place, and time. Coordination normal.  Skin: Skin is warm and dry.  Psychiatric: He has a normal mood and affect. His behavior is normal.  Nursing note and vitals reviewed.   ED Course  Procedures (including critical care time) Labs Review Labs Reviewed  COMPREHENSIVE METABOLIC PANEL - Abnormal; Notable for the following:    Glucose, Bld 237 (*)    Calcium 10.5 (*)    All other components within normal limits  ACETAMINOPHEN LEVEL - Abnormal; Notable for the following:    Acetaminophen (Tylenol), Serum <10 (*)    All other components within normal limits  ETHANOL  SALICYLATE LEVEL  CBC  URINE RAPID DRUG SCREEN, HOSP PERFORMED  URINALYSIS, ROUTINE W REFLEX MICROSCOPIC (NOT AT [facility])    Imaging Review No results found. I have personally reviewed and evaluated these images and lab results as part of my medical decision-making.   EKG  Interpretation None      MDM   Final diagnoses:  Stress   Patient presenting at the request of his daughter for concerning statements about suicide he made today. Denies all other complaints at this time. VSS and pt is non-toxic appearing. Benign physical exam and blood work is unremarkable. Patient has been medically cleared in the ED and is appropriate for TTS consultation and their recommendations. Pt is in NAD and stable for [facility].      Rolm GalaStevi Schuyler Behan, PA-C 05/19/16 0055  Dione Boozeavid Glick, MD 05/19/16 [id number]

## 2016-05-19 NOTE — Consult Note (Signed)
[facility] Consult   Reason for Consult:  IVC'd for depression with suicidal ideations Referring Physician:  EDP Patient Identification: Evan Wilkins MRN:  [id number] Principal Diagnosis: Adjustment disorder with depressed mood Diagnosis:   Patient Active Problem List   Diagnosis Date Noted  . Adjustment disorder with depressed mood [F43.21] 05/19/2016    Priority: High  . Dyslipidemia [E78.5] 07/02/2015  . Diabetes mellitus type 2, uncontrolled ([place]) [E11.65] 07/02/2015  . Bee allergy status [Z91.030] 06/30/2015  . Overweight [E66.3] 06/30/2015    Total Time spent with patient: 45 minutes  Subjective:   Evan Wilkins is a 50 y.o. male patient does not warrant admission.  HPI:  50 yo male who was IVC'd for depression and reports of suicidal ideations.  He states he went to visit his daughter at her work because he was driving her car and he needed to vent.  Evan Wilkins has been dealing with car issues and frustrated.  He reports he frequently vents to his daughter and goes home "and get over it."  Evan Wilkins was surprised when the police came to his house and brought him to the ED.  No past psychiatric history or drug abuse.  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  Denies suicidal and homicidal ideations along with her hallucinations.  He also states he does not own a gun, "You can ask anyone."  Nor does he have access to it.  His daughter was contacted with his permission.  His daughter, Jachob Mcclean, reported she no longer feels he is a threat to himself nor does he have any guns nor do her grandparents.  She reports he has no past history except for some depression when she was in middle school but did not require treatment.  No safety concerns.  Pleasant and cooperative on assessment, watching a movie "I love", no concerns noted.  Past Psychiatric History: depression  Risk to Self: Suicidal Ideation: No Suicidal  Intent: No Is patient at risk for suicide?: No Suicidal Plan?: No Access to Means: No (Pt says he does not own a gun.) What has been your use of drugs/alcohol within the last 12 months?: Denies.  UDS is clear' How many times?: 0 Other Self Harm Risks: None Triggers for Past Attempts: None known Intentional Self Injurious Behavior: None Risk to Others: Homicidal Ideation: No Thoughts of Harm to Others: No Current Homicidal Intent: No Current Homicidal Plan: No Access to Homicidal Means: No Identified Victim: None  History of harm to others?: No Assessment of Violence: None Noted Violent Behavior Description: None Does patient have access to weapons?: No (Pt states he has no guns.) Criminal Charges Pending?: No Does patient have a court date: No Prior Inpatient Therapy: Prior Inpatient Therapy: No Prior Therapy Dates: None Prior Therapy Facilty/Provider(s): None Reason for Treatment: None Prior Outpatient Therapy: Prior Outpatient Therapy: No Prior Therapy Dates: N/A Prior Therapy Facilty/Provider(s): N/A Reason for Treatment: N/a Does patient have an ACCT team?: No Does patient have Intensive In-House Services?  : No Does patient have [facility] services? : No Does patient have P4CC services?: No  Past Medical History:  Past Medical History  Diagnosis Date  . Allergy   . Arthritis   . Diabetes mellitus without complication [facility])     Past Surgical History  Procedure Laterality Date  . Back surgery    . Knee arthroscopy    . Spine surgery     Family  History:  Family History  Problem Relation Age of Onset  . Diabetes Father   . Heart disease Father   . Hypertension Father    Family Psychiatric  History: none Social History:  History  Alcohol Use No     History  Drug Use No    Social History   Social History  . Marital Status: Divorced    Spouse Name: N/A  . Number of Children: N/A  . Years of Education: N/A   Social History Main Topics  . Smoking  status: Never Smoker   . Smokeless tobacco: Never Used  . Alcohol Use: No  . Drug Use: No  . Sexual Activity: Not Asked   Other Topics Concern  . None   Social History Narrative   Additional Social History:    Allergies:   Allergies  Allergen Reactions  . Amoxicillin     Labs:  Results for orders placed or performed during the hospital encounter of 05/18/16 (from the past 48 hour(s))  Comprehensive metabolic panel     Status: Abnormal   Collection Time: 05/18/16 11:11 PM  Result Value Ref Range   Sodium 138 135 - 145 mmol/L   Potassium 4.6 3.5 - 5.1 mmol/L   Chloride 102 101 - 111 mmol/L   CO2 28 22 - 32 mmol/L   Glucose, Bld 237 (H) 65 - 99 mg/dL   BUN 15 6 - 20 mg/dL   Creatinine, Ser 1.07 0.61 - 1.24 mg/dL   Calcium 10.5 (H) 8.9 - 10.3 mg/dL   Total Protein 8.1 6.5 - 8.1 g/dL   Albumin 4.7 3.5 - 5.0 g/dL   AST 19 15 - 41 U/L   ALT 29 17 - 63 U/L   Alkaline Phosphatase 84 38 - 126 U/L   Total Bilirubin 0.3 0.3 - 1.2 mg/dL   GFR calc non Af Amer >60 >60 mL/min   GFR calc Af Amer >60 >60 mL/min    Comment: (NOTE) The eGFR has been calculated using the CKD EPI equation. This calculation has not been validated in all clinical situations. eGFR's persistently <60 mL/min signify possible Chronic Kidney Disease.    Anion gap 8 5 - 15  Ethanol     Status: None   Collection Time: 05/18/16 11:11 PM  Result Value Ref Range   Alcohol, Ethyl (B) <5 <5 mg/dL    Comment:        LOWEST DETECTABLE LIMIT FOR SERUM ALCOHOL IS 5 mg/dL FOR MEDICAL PURPOSES ONLY   Salicylate level     Status: None   Collection Time: 05/18/16 11:11 PM  Result Value Ref Range   Salicylate Lvl <1.4 2.8 - 30.0 mg/dL  Acetaminophen level     Status: Abnormal   Collection Time: 05/18/16 11:11 PM  Result Value Ref Range   Acetaminophen (Tylenol), Serum <10 (L) 10 - 30 ug/mL    Comment:        THERAPEUTIC CONCENTRATIONS VARY SIGNIFICANTLY. A RANGE OF 10-30 ug/mL MAY BE AN  EFFECTIVE CONCENTRATION FOR MANY PATIENTS. HOWEVER, SOME ARE BEST TREATED AT CONCENTRATIONS OUTSIDE THIS RANGE. ACETAMINOPHEN CONCENTRATIONS >150 ug/mL AT 4 HOURS AFTER INGESTION AND >50 ug/mL AT 12 HOURS AFTER INGESTION ARE OFTEN ASSOCIATED WITH TOXIC REACTIONS.   cbc     Status: None   Collection Time: 05/18/16 11:11 PM  Result Value Ref Range   WBC 8.5 4.0 - 10.5 K/uL   RBC 4.86 4.22 - 5.81 MIL/uL   Hemoglobin 14.5 13.0 - 17.0 g/dL   HCT  41.5 39.0 - 52.0 %   MCV 85.4 78.0 - 100.0 fL   MCH 29.8 26.0 - 34.0 pg   MCHC 34.9 30.0 - 36.0 g/dL   RDW 12.5 11.5 - 15.5 %   Platelets 222 150 - 400 K/uL  Rapid urine drug screen (hospital performed)     Status: None   Collection Time: 05/19/16 12:04 AM  Result Value Ref Range   Opiates NONE DETECTED NONE DETECTED   Cocaine NONE DETECTED NONE DETECTED   Benzodiazepines NONE DETECTED NONE DETECTED   Amphetamines NONE DETECTED NONE DETECTED   Tetrahydrocannabinol NONE DETECTED NONE DETECTED   Barbiturates NONE DETECTED NONE DETECTED    Comment:        DRUG SCREEN FOR MEDICAL PURPOSES ONLY.  IF CONFIRMATION IS NEEDED FOR ANY PURPOSE, NOTIFY LAB WITHIN 5 DAYS.        LOWEST DETECTABLE LIMITS FOR URINE DRUG SCREEN Drug Class       Cutoff (ng/mL) Amphetamine      1000 Barbiturate      200 Benzodiazepine   094 Tricyclics       076 Opiates          300 Cocaine          300 THC              50   Urinalysis, Routine w reflex microscopic (not at [facility])     Status: Abnormal   Collection Time: 05/19/16 12:04 AM  Result Value Ref Range   Color, Urine YELLOW YELLOW   APPearance CLEAR CLEAR   Specific Gravity, Urine 1.014 1.005 - 1.030   pH 5.5 5.0 - 8.0   Glucose, UA 250 (A) NEGATIVE mg/dL   Hgb urine dipstick NEGATIVE NEGATIVE   Bilirubin Urine NEGATIVE NEGATIVE   Ketones, ur NEGATIVE NEGATIVE mg/dL   Protein, ur NEGATIVE NEGATIVE mg/dL   Nitrite NEGATIVE NEGATIVE   Leukocytes, UA NEGATIVE NEGATIVE    Comment: MICROSCOPIC NOT  DONE ON URINES WITH NEGATIVE PROTEIN, BLOOD, LEUKOCYTES, NITRITE, OR GLUCOSE <1000 mg/dL.    No current facility-administered medications for this encounter.   Current Outpatient Prescriptions  Medication Sig Dispense Refill  . EPINEPHrine (EPIPEN 2-PAK) 0.3 mg/0.3 mL IJ SOAJ injection Inject 0.3 mLs (0.3 mg total) into the muscle once. 2 Device 2  . ibuprofen (ADVIL,MOTRIN) 600 MG tablet Take 1 tablet (600 mg total) by mouth every 6 (six) hours as needed. 30 tablet 0  . metFORMIN (GLUCOPHAGE) 1000 MG tablet Take 1 twice a day 180 tablet 3  . pravastatin (PRAVACHOL) 40 MG tablet Take 1 tablet (40 mg total) by mouth daily. 90 tablet 3    Musculoskeletal: Strength & Muscle Tone: within normal limits Gait & Station: normal Patient leans: N/A  Psychiatric Specialty Exam: Physical Exam  Constitutional: He is oriented to person, place, and time. He appears well-developed and well-nourished.  HENT:  Head: Normocephalic.  Neck: Normal range of motion.  Respiratory: Effort normal.  Musculoskeletal: Normal range of motion.  Neurological: He is alert and oriented to person, place, and time.  Skin: Skin is warm and dry.  Psychiatric: He has a normal mood and affect. His speech is normal and behavior is normal. Judgment and thought content normal. Cognition and memory are normal.    Review of Systems  Constitutional: Negative.   HENT: Negative.   Eyes: Negative.   Respiratory: Negative.   Cardiovascular: Negative.   Gastrointestinal: Negative.   Genitourinary: Negative.   Musculoskeletal: Negative.   Skin: Negative.  Neurological: Negative.   Endo/Heme/Allergies: Negative.   Psychiatric/Behavioral: Negative.     Blood pressure 139/82, pulse 81, temperature 97.6 F (36.4 C), temperature source Oral, resp. rate 16, height 6' (1.829 m), weight 102.059 kg (225 lb), SpO2 100 %.Body mass index is 30.51 kg/(m^2).  General Appearance: Casual  Eye Contact:  Good  Speech:  Normal Rate   Volume:  Normal  Mood:  Euthymic  Affect:  Congruent  Thought Process:  Coherent  Orientation:  Full (Time, Place, and Person)  Thought Content:  WDL  Suicidal Thoughts:  No  Homicidal Thoughts:  No  Memory:  Immediate;   Good Recent;   Good Remote;   Good  Judgement:  Fair  Insight:  Good  Psychomotor Activity:  Normal  Concentration:  Concentration: Good and Attention Span: Good  Recall:  Good  Fund of Knowledge:  Good  Language:  Good  Akathisia:  No  Handed:  Right  AIMS (if indicated):     Assets:  Communication Skills Desire for Improvement Financial Resources/Insurance Housing Leisure Time Physical Health Resilience Social Support Talents/Skills Transportation Vocational/Educational  ADL's:  Intact  Cognition:  WNL  Sleep:        Treatment Plan Summary: Daily contact with patient to assess and evaluate symptoms and progress in treatment, Medication management and Plan adjustment disorder with depressed mood:  -Crisis stabilization -Medication management:  No medications required -Individual counseling -Outpatient resources provided  Disposition: No evidence of imminent risk to self or others at present.    Waylan Boga, NP 05/19/2016 11:24 AM Patient seen face-to-face for psychiatric evaluation, chart reviewed and case discussed with the physician extender and developed treatment plan. Reviewed the information documented and agree with the treatment plan. Corena Pilgrim, MD

## 2017-04-22 ENCOUNTER — Encounter (HOSPITAL_BASED_OUTPATIENT_CLINIC_OR_DEPARTMENT_OTHER): Payer: Self-pay | Admitting: *Deleted

## 2017-04-22 ENCOUNTER — Emergency Department (HOSPITAL_BASED_OUTPATIENT_CLINIC_OR_DEPARTMENT_OTHER): Payer: Self-pay

## 2017-04-22 ENCOUNTER — Emergency Department (HOSPITAL_BASED_OUTPATIENT_CLINIC_OR_DEPARTMENT_OTHER)
Admission: EM | Admit: 2017-04-22 | Discharge: 2017-04-22 | Disposition: A | Discharge status: Home / Self Care | Payer: Self-pay | Attending: Emergency Medicine | Admitting: Emergency Medicine

## 2017-04-22 DIAGNOSIS — Z79899 Other long term (current) drug therapy: Secondary | ICD-10-CM | POA: Insufficient documentation

## 2017-04-22 DIAGNOSIS — Y92009 Unspecified place in unspecified non-institutional (private) residence as the place of occurrence of the external cause: Secondary | ICD-10-CM | POA: Insufficient documentation

## 2017-04-22 DIAGNOSIS — E119 Type 2 diabetes mellitus without complications: Secondary | ICD-10-CM | POA: Insufficient documentation

## 2017-04-22 DIAGNOSIS — W228XXA Striking against or struck by other objects, initial encounter: Secondary | ICD-10-CM | POA: Insufficient documentation

## 2017-04-22 DIAGNOSIS — Z7984 Long term (current) use of oral hypoglycemic drugs: Secondary | ICD-10-CM | POA: Insufficient documentation

## 2017-04-22 DIAGNOSIS — Y9389 Activity, other specified: Secondary | ICD-10-CM | POA: Insufficient documentation

## 2017-04-22 DIAGNOSIS — Y999 Unspecified external cause status: Secondary | ICD-10-CM | POA: Insufficient documentation

## 2017-04-22 DIAGNOSIS — S060X9A Concussion with loss of consciousness of unspecified duration, initial encounter: Secondary | ICD-10-CM | POA: Insufficient documentation

## 2017-04-22 MED ORDER — DIPHENHYDRAMINE HCL 50 MG/ML IJ SOLN
25.0000 mg | Freq: Once | INTRAMUSCULAR | Status: DC
  Filled 2017-04-22: qty 1

## 2017-04-22 MED ORDER — PROCHLORPERAZINE EDISYLATE 5 MG/ML IJ SOLN
10.0000 mg | Freq: Once | INTRAMUSCULAR | Status: AC
  Administered 2017-04-22: 10 mg via INTRAMUSCULAR
  Filled 2017-04-22: qty 2

## 2017-04-22 MED ORDER — DIPHENHYDRAMINE HCL 50 MG/ML IJ SOLN
25.0000 mg | Freq: Once | INTRAMUSCULAR | Status: AC
  Administered 2017-04-22: 25 mg via INTRAMUSCULAR
  Filled 2017-04-22: qty 1

## 2017-04-22 MED ORDER — METOCLOPRAMIDE HCL 5 MG/ML IJ SOLN
10.0000 mg | Freq: Once | INTRAMUSCULAR | Status: DC
  Filled 2017-04-22: qty 2

## 2017-04-22 MED ORDER — KETOROLAC TROMETHAMINE 60 MG/2ML IM SOLN
30.0000 mg | Freq: Once | INTRAMUSCULAR | Status: AC
  Administered 2017-04-22: 30 mg via INTRAMUSCULAR
  Filled 2017-04-22: qty 2

## 2017-04-22 NOTE — Discharge Instructions (Signed)
Take 4 over the counter ibuprofen tablets 3 times a day or 2 over-the-counter naproxen tablets twice a day for pain. Also take tylenol 1000mg(2 extra strength) four times a day.    

## 2017-04-22 NOTE — ED Notes (Signed)
ED Provider at bedside. 

## 2017-04-22 NOTE — ED Notes (Signed)
Patient transported to CT 

## 2017-04-22 NOTE — ED Provider Notes (Signed)
[facility] Note   CSN: [id number] Arrival date & time: 04/22/17  1405     History   Chief Complaint Chief Complaint  Patient presents with  . Head Injury    HPI Evan Wilkins is a 51 y.o. male.  51 yo M with a chief complaint of a head injury. This occurred about 2 weeks ago. Patient was under a house installing a hot water heater. He suddenly raised his head and hit a floor joists. Unknown length of loss of consciousness. Since then he's been having a constant occipital headache going on for the 2 weeks. Feels that it's worsening. Everything seems to make it worse including palpation. Denies vomiting denies fevers denies neck pain. Denies unilateral weakness or numbness. Patient is concerned that he has something wrong inside of his head and needs a picture for this.   The history is provided by the patient.  Head Injury   The incident occurred more than 1 week ago. He came to the ER via walk-in. The injury mechanism was a direct blow. He lost consciousness for a period of 1 to 5 minutes. There was no blood loss. The quality of the pain is described as sharp and dull. The pain is at a severity of 10/10. The pain is severe. The pain has been constant since the injury. Pertinent negatives include no vomiting. He has tried nothing for the symptoms. The treatment provided no relief.    Past Medical History:  Diagnosis Date  . Allergy   . Arthritis   . Diabetes mellitus without complication [facility])     Patient Active Problem List   Diagnosis Date Noted  . Adjustment disorder with depressed mood 05/19/2016  . Dyslipidemia 07/02/2015  . Diabetes mellitus type 2, uncontrolled ([facility]) 07/02/2015  . Bee allergy status 06/30/2015  . Overweight 06/30/2015    Past Surgical History:  Procedure Laterality Date  . BACK SURGERY    . KNEE ARTHROSCOPY    . SPINE SURGERY         Home Medications    Prior to Admission medications   Medication Sig Start Date End  Date Taking? Authorizing Provider  EPINEPHrine (EPIPEN 2-PAK) 0.3 mg/0.3 mL IJ SOAJ injection Inject 0.3 mLs (0.3 mg total) into the muscle once. 06/30/15   Gwenlyn Found Copland, MD  ibuprofen (ADVIL,MOTRIN) 600 MG tablet Take 1 tablet (600 mg total) by mouth every 6 (six) hours as needed. 04/28/14   Shon Baton, MD  metFORMIN (GLUCOPHAGE) 1000 MG tablet Take 1 twice a day 10/03/15   Pearline Cables, MD  pravastatin (PRAVACHOL) 40 MG tablet Take 1 tablet (40 mg total) by mouth daily. 10/03/15   Pearline Cables, MD    Family History Family History  Problem Relation Age of Onset  . Diabetes Father   . Heart disease Father   . Hypertension Father     Social History Social History  Substance Use Topics  . Smoking status: Never Smoker  . Smokeless tobacco: Never Used  . Alcohol use No     Allergies   Amoxicillin   Review of Systems Review of Systems  Constitutional: Negative for chills and fever.  HENT: Negative for congestion and facial swelling.   Eyes: Negative for discharge and visual disturbance.  Respiratory: Negative for shortness of breath.   Cardiovascular: Negative for chest pain and palpitations.  Gastrointestinal: Negative for abdominal pain, diarrhea and vomiting.  Musculoskeletal: Negative for arthralgias and myalgias.  Skin: Negative for color change  and rash.  Neurological: Positive for headaches. Negative for tremors and syncope.  Psychiatric/Behavioral: Negative for confusion and dysphoric mood.     Physical Exam Updated Vital Signs BP (!) 174/87   Pulse 92   Temp 98.1 F (36.7 C)   Resp 16   Ht 6' (1.829 m)   Wt 200 lb (90.7 kg)   SpO2 100%   BMI 27.12 kg/m   Physical Exam  Constitutional: He is oriented to person, place, and time. He appears well-developed and well-nourished.  HENT:  Head: Normocephalic and atraumatic.  Eyes: EOM are normal. Pupils are equal, round, and reactive to light.  Neck: Normal range of motion. Neck supple. No JVD  present.  Cardiovascular: Normal rate and regular rhythm.  Exam reveals no gallop and no friction rub.   No murmur heard. Pulmonary/Chest: No respiratory distress. He has no wheezes.  Abdominal: He exhibits no distension. There is no rebound and no guarding.  Musculoskeletal: Normal range of motion.  Neurological: He is alert and oriented to person, place, and time. He has normal strength. No cranial nerve deficit or sensory deficit. He displays a negative Romberg sign. Coordination and gait normal. GCS eye subscore is 4. GCS verbal subscore is 5. GCS motor subscore is 6. He displays no Babinski's sign on the right side. He displays no Babinski's sign on the left side.  Reflex Scores:      Tricep reflexes are 2+ on the right side and 2+ on the left side.      Bicep reflexes are 2+ on the right side and 2+ on the left side.      Brachioradialis reflexes are 2+ on the right side and 2+ on the left side.      Patellar reflexes are 2+ on the right side and 2+ on the left side.      Achilles reflexes are 2+ on the right side and 2+ on the left side. Skin: No rash noted. No pallor.  Psychiatric: He has a normal mood and affect. His behavior is normal.  Nursing note and vitals reviewed.    ED Treatments / Results  Labs (all labs ordered are listed, but only abnormal results are displayed) Labs Reviewed - No data to display  EKG  EKG Interpretation None       Radiology Ct Head Wo Contrast  Result Date: 04/22/2017 CLINICAL DATA:  Posterior headache after injury 2 weeks ago. Initial encounter. EXAM: CT HEAD WITHOUT CONTRAST TECHNIQUE: Contiguous axial images were obtained from the base of the skull through the vertex without intravenous contrast. COMPARISON:  None. FINDINGS: Brain: No evidence of acute infarction, hemorrhage, hydrocephalus, extra-axial collection or mass lesion/mass effect. Small remote right cerebellar infarct inferiorly. Vascular: Arterial calcification.  No acute finding.  Skull: Negative for fracture.  No noted scalp swelling. Sinuses/Orbits: Chronic right sphenoid sinusitis with sclerotic wall thickening and near complete opacification. IMPRESSION: 1. No acute or posttraumatic finding. 2. Small remote right cerebellar infarct. 3. Chronic right sphenoid sinusitis. Electronically Signed   By: Marnee Spring M.D.   On: 04/22/2017 16:56    Procedures Procedures (including critical care time)  Medications Ordered in ED Medications  metoCLOPramide (REGLAN) injection 10 mg (not administered)  diphenhydrAMINE (BENADRYL) injection 25 mg (not administered)  prochlorperazine (COMPAZINE) injection 10 mg (10 mg Intramuscular Given 04/22/17 1639)  diphenhydrAMINE (BENADRYL) injection 25 mg (25 mg Intramuscular Given 04/22/17 1639)  ketorolac (TORADOL) injection 30 mg (30 mg Intramuscular Given 04/22/17 1639)     Initial Impression /  Assessment and Plan / ED Course  I have reviewed the triage vital signs and the nursing notes.  Pertinent labs & imaging results that were available during my care of the patient were reviewed by me and considered in my medical decision making (see chart for details).     51 yo M with a chief complaint of a closed head injury. This occurred 2 weeks ago. Benign neuro exam. Discussed limitations of imaging with the patient and stated that he needed a CT scan.    CT negative.  Patient with no improvement with headache cocktail.  Will have follow up with concussion clinic.   5:12 PM:  I have discussed the diagnosis/risks/treatment options with the patient and family and believe the pt to be eligible for discharge home to follow-up with PCP. We also discussed returning to the ED immediately if new or worsening sx occur. We discussed the sx which are most concerning (e.g., sudden worsening pain, fever, inability to tolerate by mouth) that necessitate immediate return. Medications administered to the patient during their visit and any new prescriptions  provided to the patient are listed below.  Medications given during this visit Medications  metoCLOPramide (REGLAN) injection 10 mg (not administered)  diphenhydrAMINE (BENADRYL) injection 25 mg (not administered)  prochlorperazine (COMPAZINE) injection 10 mg (10 mg Intramuscular Given 04/22/17 1639)  diphenhydrAMINE (BENADRYL) injection 25 mg (25 mg Intramuscular Given 04/22/17 1639)  ketorolac (TORADOL) injection 30 mg (30 mg Intramuscular Given 04/22/17 1639)     The patient appears reasonably screen and/or stabilized for discharge and I doubt any other medical condition or other [facility] requiring further screening, evaluation, or treatment in the ED at this time prior to discharge.    Final Clinical Impressions(s) / ED Diagnoses   Final diagnoses:  Concussion with loss of consciousness, initial encounter    New Prescriptions New Prescriptions   No medications on file     Melene Plan, DO 04/22/17 1712

## 2017-04-22 NOTE — ED Triage Notes (Signed)
Pt c/o head injury x 2 weeks ago , c/o cont h/a

## 2017-04-23 ENCOUNTER — Telehealth: Payer: Self-pay

## 2017-04-23 NOTE — Telephone Encounter (Signed)
Spoke with patient. He was seen in ED yesterday for post-concussive symptoms. CT was performed which was negative. He was given some medication for his headache which hasn't relieved it. Patient was underneath his parents home 2 weeks ago when he was startled and stood up suddenly hitting his occiput on a wood 2x4 rafter. He believes that he had a short LOC but is unsure of duration. He has been experiencing headache, nausea, eye pain, cognitive issues, photo and phonophobia. 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 No history of seizures or anxiety or depression. Patient does have a migraine history but has not experienced a migraine in many years. Recommended to patient that he refrain from physical activity that may cause increase in symptoms as well as limit screen time to 30 minutes per day. Also told patient if he has sudden increase in symptoms to go to ED before appointment. Patient voices understanding.

## 2017-04-28 NOTE — Progress Notes (Signed)
Subjective:     Chief Complaint: Evan Wilkins, DOB: 01/06/1966, is a 51 y.o. male who presents for head injury. Patient was underneath his parents home 2 weeks ago when he was startled and stood up suddenly hitting his occiput on a wood 2x4 rafter. He believes that he had a short LOC but is unsure of duration. He has been experiencing headache, nausea, eye pain, cognitive issues, photo and phonophobia. 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 No history of seizures or anxiety or depression. Patient does have a migraine history but has not experienced a migraine in many years. Patient seen in ED on 5/2 as his symptoms were not going away. He said CT was performed and was negative per patient. Since ED visit, patient has hit head again while trying to pull a mower out from underneath something. Location of impact was on the posterior skull. No loss of consciousness with this hit and patient continued to mow the lawn. Patient states that his headache has persisted since the first impact but has worsened since the second impact last week.   Reviewed patient's CT scan. 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  Injury date : 04/22/2017 Visit #: 1  History of Present Illness:    Concussion Self-Reported Symptom Score Symptoms rated on a scale 1-6, in last 24 hours  Headache: 6   Nausea: 3  Vomiting: 0  Balance Difficulty: 3   Dizziness: 4  Fatigue: 2  Trouble Falling Asleep: 0   Sleep More Than Usual: 2  Sleep Less Than Usual: 0  Daytime Drowsiness: 2  Photophobia: 4  Phonophobia: 4  Feeling anxious: 3  Sadness: 1  Nervousness: 3  Feeling More Emotional: 1  Numbness or Tingling: 2  Feeling Slowed Down: 2  Feeling Mentally Foggy: 2  Difficulty Concentrating: 1  Difficulty Remembering: 0  Visual Problems: 0    Total Symptom Score: 45  Review of Systems: Pertinent items are noted in HPI.  Review of  History: Past Medical History: @PMHP @  Past Surgical History:  has a past surgical history that includes Back surgery; Knee arthroscopy; and Spine surgery. Family History: family history includes Diabetes in his father; Heart disease in his father; Hypertension in his father. Social History:  reports that he has never smoked. He has never used smokeless tobacco. He reports that he does not drink alcohol or use drugs. Current Medications: has a current medication list which includes the following prescription(s): epinephrine, ibuprofen, metformin, pravastatin, and tramadol. Allergies: is allergic to amoxicillin.  Objective:    Physical Examination Vitals:   04/29/17 1425 04/29/17 1540  BP: (!) 180/98 (!) 160/88  Pulse: 98    General appearance: alert, appears stated age and cooperativeDoes appear though uncomfortable Head: Normocephalic, without obvious abnormality, atraumatic Eyes: conjunctivae/corneas clear. Pupils are not equal and does have a sluggish response to light. Patient also has significant nystagmus on testing Lungs: clear to auscultation bilaterally and percussion Heart: regular rate and rhythm, S1, S2 normal, no murmur, click, rub or gallop Neurologic: CN 2-12 normal.  Sensation to pain, touch, and proprioception normal.  DTRs  normal in upper and lower extremities. No pathologic reflexes. Neg rhomberg, modified rhomberg, pronator drift, tandem gait, finger-to-nose; see post-concussion vestibular and oculomotor testing in chart Psychiatric: Oriented X3, intact recent and remote memory, judgement and insight, normal but does seem frustrated  Concussion testing performed today:  Neurocognitive testing (ImPACT):   Post #1  Verbal Memory Composite  83 (58%)   Visual Memory Composite  71 (50%)   Visual Motor Speed Composite  17.40(<1%) Invalid  Reaction Time Composite  1.29 (<1%) Invalid  Cognitive Efficiency Index  .01 Invalid   Vestibular Screening:   Pre VOMS  HA  Score:  Pre VOMS  Dizziness Score:    Headache  Dizziness  Smooth Pursuits n n  H. Saccades y n  V. Saccades Left eye saccadic movements Discontinued test from here  H. VOR    V. VOR    Visual Motor Sensitivity               Additional testing performed today:    Assessment:    No diagnosis found.  Evan Wilkins presents with the following concussion subtypes. [] Cognitive [] Cervical [] Vestibular [x] Ocular [] Migraine [x] Anxiety/Mood   Plan:   Action/Discussion: Reviewed diagnosis, management options, expected outcomes, and the reasons for scheduled and emergent follow-up. Questions were adequately answered. Patient expressed verbal understanding and agreement with the following plan.       Active Treatment Strategies:  Fueling your brain is important for recovery. It is essential to stay well hydrated, aiming for half of your body weight in fluid ounces per day (100 lbs = 50 oz). We also recommend eating breakfast to start your day and focus on a well-balanced diet containing lean protein, 'good' fats, and complex carbohydrates. See your nutrition / hydration handout for more details.  as directed.   Patient Education:  Reviewed with patient the risks (i.e, a repeat concussion, post-concussion syndrome, second-impact syndrome) of returning to play prior to complete resolution, and thoroughly reviewed the signs and symptoms of concussion.Reviewed need for complete resolution of all symptoms, with rest AND exertion, prior to return to play.  Reviewed red flags for urgent medical evaluation: worsening symptoms, nausea/vomiting, intractable headache, musculoskeletal changes, focal neurological deficits.  [facility] Concussion Care Plan, which clearly outlines the plans stated above, was given to patient.  I was personally involved with the physical evaluation of and am in agreement with the assessment and treatment plan for this patient.  Greater than 50%  of this encounter was spent in direct consultation with the patient in evaluation, counseling, and coordination of care. Duration of encounter: 50 minutes.  After Visit Summary printed out and provided to patient as appropriate.  This note is written by Judi SaaZachary M Kaelene Elliston, in the presence of and acting as the scribe of Judi SaaZachary M Gid Schoffstall, DO.

## 2017-04-29 ENCOUNTER — Encounter: Payer: Self-pay | Admitting: Family Medicine

## 2017-04-29 ENCOUNTER — Ambulatory Visit (INDEPENDENT_AMBULATORY_CARE_PROVIDER_SITE_OTHER): Payer: Self-pay | Admitting: Family Medicine

## 2017-04-29 ENCOUNTER — Other Ambulatory Visit: Payer: Self-pay

## 2017-04-29 DIAGNOSIS — S0990XA Unspecified injury of head, initial encounter: Secondary | ICD-10-CM

## 2017-04-29 DIAGNOSIS — I639 Cerebral infarction, unspecified: Secondary | ICD-10-CM

## 2017-04-29 MED ORDER — TRAMADOL HCL 50 MG PO TABS: 50.0000 mg | ORAL_TABLET | Freq: Three times a day (TID) | ORAL | 0 refills | Status: AC | PRN

## 2017-04-29 NOTE — Patient Instructions (Addendum)
Good to see you  I need to get MRI as soon as I can and they will be calling you  Once you know when it it contact us and we will get you schedule the next day.  Tramadol up to 3 times a day for pain  Limit screen time to 1 hour a day  If the headaches worsens you need to go to the ER and tell them concern for a cerebellar infarct  We will get to the bottom of this.

## 2017-04-29 NOTE — Assessment & Plan Note (Signed)
Patient does have a head injury. When reviewing the impact scores I am not as concerned for potential concussion versus another potential injury. Patient does have what appears to be a remote cerebellar infarct. Patient has never been seen for this previously. Patient is having difficulty with nausea, headache, as well as some visual disturbances. This could be a visual for ocular concussion Burson ocular migraine is within the differential. We discussed with patient having another injury that he may want to consider going to the emergency room for further evaluation. Patient declined that today. I do feel that with patient having a potential cerebellar infarct that patient should be evaluated with an MRI stat. Patient did have these put in and we will do an MRI of the brain as well as an MRA brain and neck for further evaluation of the blood vessels. Discussed with patient at great length about the red flags and when to seek medical attention. Patient did have daughter at bedside and we discussed this with her as well. We will see him one day after the MRI to further discuss. Given some tramadol for mild pain relief. Encourage him to continue the statin as well as the metformin.

## 2017-04-30 ENCOUNTER — Other Ambulatory Visit: Payer: Self-pay

## 2017-04-30 DIAGNOSIS — I639 Cerebral infarction, unspecified: Secondary | ICD-10-CM

## 2017-05-01 ENCOUNTER — Telehealth: Payer: Self-pay

## 2017-05-01 NOTE — Telephone Encounter (Signed)
Spoke with patient yesterday. Called to let him know that Dr. Katrinka BlazingSmith wants the MRI/MRA's performed sooner than 05/12/2017 [facility] and that we were able to get him scheduled with [facility] on 05/02/2017 at 4:00pm. Patient voices understanding. Inquired how patient was feeling. He explained that he still has a headache at the same intensity that he did the day before. Let him know that Dr. Katrinka BlazingSmith would like for him to monitor his symptoms and if they increase at all that Dr. Katrinka BlazingSmith recommends that he go into the ER. Patient voices understanding.

## 2017-05-02 ENCOUNTER — Encounter (HOSPITAL_COMMUNITY): Payer: Self-pay

## 2017-05-02 ENCOUNTER — Ambulatory Visit (HOSPITAL_COMMUNITY)
Admission: RE | Admit: 2017-05-02 | Discharge: 2017-05-02 | Disposition: A | Discharge status: Home / Self Care | Payer: Self-pay | Source: Ambulatory Visit | Attending: Family Medicine | Admitting: Family Medicine

## 2017-05-02 DIAGNOSIS — J323 Chronic sphenoidal sinusitis: Secondary | ICD-10-CM | POA: Insufficient documentation

## 2017-05-02 DIAGNOSIS — I639 Cerebral infarction, unspecified: Secondary | ICD-10-CM | POA: Insufficient documentation

## 2017-05-02 DIAGNOSIS — Z8673 Personal history of transient ischemic attack (TIA), and cerebral infarction without residual deficits: Secondary | ICD-10-CM | POA: Insufficient documentation

## 2017-05-02 MED ORDER — GADOBENATE DIMEGLUMINE 529 MG/ML IV SOLN
20.0000 mL | Freq: Once | INTRAVENOUS | Status: AC | PRN
  Administered 2017-05-02: 20 mL via INTRAVENOUS

## 2017-05-05 NOTE — Progress Notes (Signed)
Spoke with patient about MRI. He wanted to know more details and I said that he could speak with Dr. Katrinka BlazingSmith during his appointment. Scheduled on Thursday morning.

## 2017-05-07 ENCOUNTER — Encounter: Payer: Self-pay | Admitting: Family Medicine

## 2017-05-07 ENCOUNTER — Ambulatory Visit (INDEPENDENT_AMBULATORY_CARE_PROVIDER_SITE_OTHER): Payer: Self-pay | Admitting: Family Medicine

## 2017-05-07 DIAGNOSIS — S0990XD Unspecified injury of head, subsequent encounter: Secondary | ICD-10-CM

## 2017-05-07 NOTE — Assessment & Plan Note (Signed)
Patient did have more of a head injury. Does now respond more into a concussion-like symptom. Because the length of duration and patient's testing previously still concern of this being more of just true head injury. I do think that there is some associated anxiety that could be contribute in. We will start with an different treatment. These treatments that have been evaluated by appears. Has responded fairly well. Hopefully patient will have the same response. Patient will try these changes and come back and see me again in 2-3 weeks.

## 2017-05-07 NOTE — Patient Instructions (Addendum)
Good to see you Lets treat you with some other medicines. .    To help improve COGNITIVE function: Using fish oil/omega 3 that is 1000 mg (or roughly 600 mg EPA/DHA), starting as soon as possible after concussion, take: 3 tabs ONCE DAILY  for the next 21 days   To help reduce HEADACHES: Coenzyme Q10 160mg  ONCE DAILY Riboflavin/Vitamin B2 400mg  ONCE DAILY Magnesium oxide 400mg  ONCE - TWICE DAILY May stop after headaches are resolved.      Other medicines to help decrease inflammation Turmeric 500mg  twice daily  I want to see you again in 3 weeks

## 2017-05-07 NOTE — Progress Notes (Signed)
Subjective:     Chief Complaint: Evan Wilkins, DOB: 10-21-1966, is a 51 y.o. male who presents for head injury. Patient was underneath his parents home 2 weeks ago when he was startled and stood up suddenly hitting his occiput on a wood 2x4 rafter. He believes that he had a short LOC but is unsure of duration. He has been experiencing headache, nausea, eye pain, cognitive issues, photo and phonophobia. 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 No history of seizures or anxiety or depression. Patient does have a migraine history but has not experienced a migraine in many years. Patient seen in ED on 5/2 as his symptoms were not going away. He said CT was performed and was negative per patient. Since ED visit, patient has hit head again while trying to pull a mower out from underneath something. Location of impact was on the posterior skull. No loss of consciousness with this hit and patient continued to mow the lawn. Patient states that his headache has persisted since the first impact but has worsened since the second impact last week.   Patient was seen by me and was having severe amount of headache. Was sent for an MRI and an MR angiogram on patient's head and neck. These were independently visualized by me showing no significant bleed or any acute changes recently. Was concerned because there was a potential for an occipital infarct. Patient states since this is happening continues to have him headache. Maybe not quite as severe. Continues to have significant amount of fatigue. Still no or near feeling like himself. Has tried some over-the-counter medications with no significant benefit.  Reviewed patient's CT scan. 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  Injury date : 04/22/2017 Visit #: 1  History of Present Illness:    Concussion Self-Reported Symptom Score Symptoms rated on a scale 1-6, in last 24 hours  Headache: 6   Nausea: 1  Vomiting: 0  Balance Difficulty: 1   Dizziness: 2  Fatigue: 2  Trouble Falling Asleep: 0   Sleep More Than Usual: 2  Sleep Less Than Usual: 0  Daytime Drowsiness: 2  Photophobia: 4  Phonophobia: 4  Feeling anxious: 4  Sadness: 1  Nervousness: 2  Feeling More Emotional: 1  Numbness or Tingling: 0  Feeling Slowed Down: 2  Feeling Mentally Foggy: 3  Difficulty Concentrating: 1  Difficulty Remembering: 0  Visual Problems: 0    Total Symptom Score: 42  Review of Systems: Pertinent items are noted in HPI.  Review of History: Past Medical History: @PMHP @  Past Surgical History:  has a past surgical history that includes Back surgery; Knee arthroscopy; and Spine surgery. Family History: family history includes Diabetes in his father; Heart disease in his father; Hypertension in his father. Social History:  reports that he has never smoked. He has never used smokeless tobacco. He reports that he does not drink alcohol or use drugs. Current Medications: has a current medication list which includes the following prescription(s): epinephrine, ibuprofen, metformin, pravastatin, and tramadol. Allergies: is allergic to amoxicillin.  Objective:    Physical Examination Vitals:   05/07/17 0951  BP: (!) 142/90  Pulse: 74   General appearance: alert, appears stated age and cooperative able to make significant better I contact previous exam. Head: Normocephalic, without obvious abnormality, atraumatic Eyes: conjunctivae/corneas clear. Pupils are not equal and does have a sluggish response to light. Patient's nystagmus and does exhaust and  improved but still noted Lungs: clear to auscultation bilaterally and percussion Heart: regular rate and rhythm, S1, S2 normal, no murmur, click, rub or gallop Neurologic: CN 2-12 normal.  Sensation to pain, touch, and proprioception normal.  DTRs  normal in upper and lower extremities. No pathologic reflexes. Neg rhomberg, modified rhomberg,  pronator drift, tandem gait, finger-to-nose; Psychiatric: Oriented X3, intact recent and remote memory, judgement and insight, normal but does seem frustrated  Additional testing performed today:    Assessment:    No diagnosis found.  Evan Rana presents with the following concussion subtypes. [] Cognitive [] Cervical [] Vestibular [x] Ocular [] Migraine [x] Anxiety/Mood   Plan:   I was personally involved with the physical evaluation of and am in agreement with the assessment and treatment plan for this patient.  Greater than 50% of this encounter was spent in direct consultation with the patient in evaluation, counseling, and coordination of care. Duration of encounter: 30 minutes.  After Visit Summary printed out and provided to patient as appropriate.

## 2017-05-12 ENCOUNTER — Other Ambulatory Visit: Payer: Self-pay

## 2017-05-22 ENCOUNTER — Telehealth: Payer: Self-pay | Admitting: General Practice

## 2017-05-22 NOTE — Telephone Encounter (Signed)
Patient states he stopped at the corner desk after appointment to set up next appointment. He called to make sure when it was. Nothing is in epic as far as an future appointment. I have set him up one on June 26. He states he can not wait that long. He would like to know if he can be worked in sooner. He was informed Dr. Katrinka BlazingSmith will be out of office and he is booked. He could be placed on a wait list or [facility] Patient would like to be advise by Dr. Katrinka BlazingSmith what he should do. But really wants to be worked in. Please advise, Thank you.

## 2017-05-22 NOTE — Telephone Encounter (Signed)
We will see if there is a cancellation in next 2 weeks

## 2017-05-25 NOTE — Telephone Encounter (Signed)
Left message for patient that we would place his name on wait list in case of cancellation prior to his June 26th appointment.

## 2017-05-25 NOTE — Telephone Encounter (Signed)
Left message for patient in order to see if 9:15 on 6/18 would work for him to follow-up with Dr. Katrinka BlazingSmith.

## 2017-06-16 ENCOUNTER — Ambulatory Visit: Payer: Self-pay | Admitting: Family Medicine

## 2018-04-18 IMAGING — MR MR MRA HEAD W/O CM
12 of 20 series · 23 of 48 positions shown · IV contrast (multihance)
Comparison: Prior CT from 04/22/2017.

CLINICAL DATA: Initial evaluation for acute headache, blurry
vision.



[Series 3: T1 · sagittal · 5.0mm · 0.47mm/px · 2 of 24 slices shown]
[im 1/24]
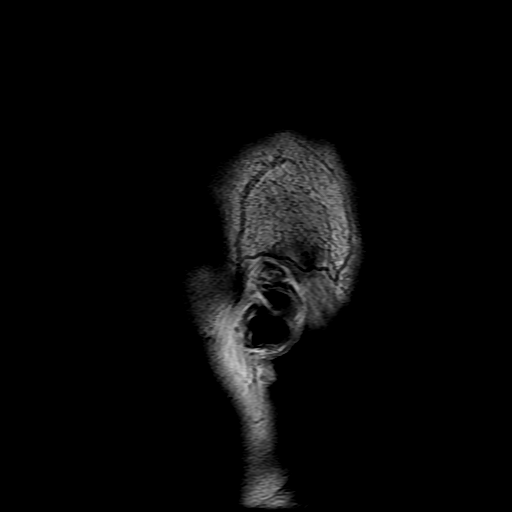
[im 24/24]
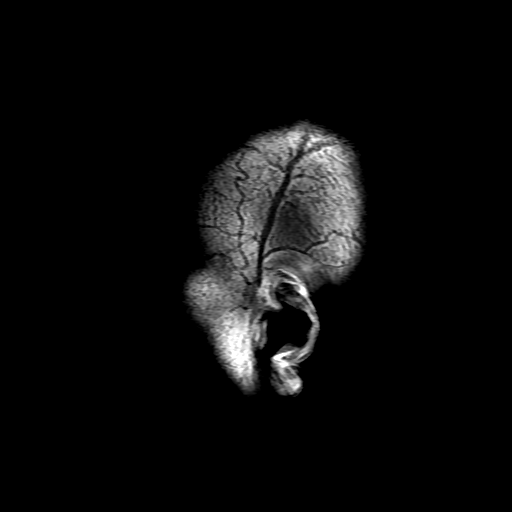

[Series 4: DWI · axial · 3.0mm · 1.09mm/px · z∈[-12,+138]mm · 4 of 102 slices shown (1 of 4)]
[im 1/102]
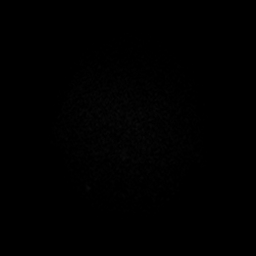
[im 34/102]
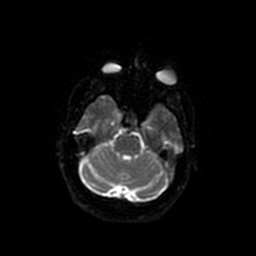
[im 68/102]
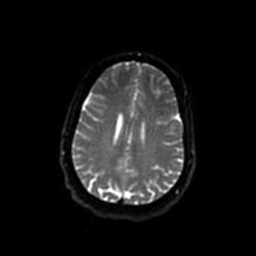
[im 102/102]
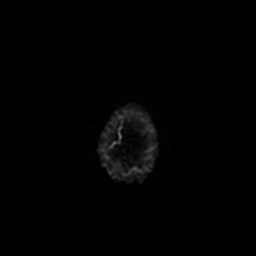

[Series 5: DWI · coronal · 5.0mm · 1.09mm/px · 3 of 70 slices shown (2 of 4)]
[im 1/70]
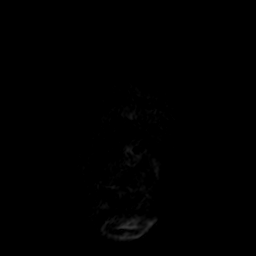
[im 35/70]
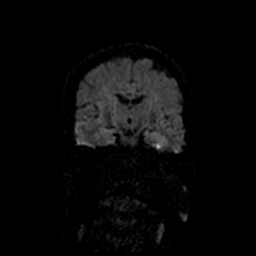
[im 70/70]
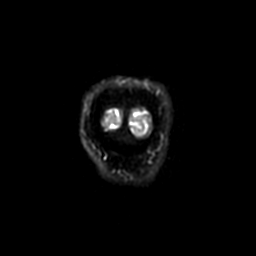

[Series 6: T2 · axial · 5.0mm · 0.43mm/px · 1 of 23 slices shown (1 of 2)]
[im 1/23]
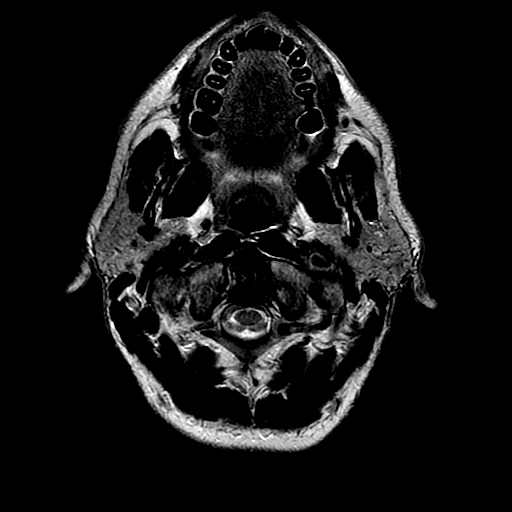

[Series 7: FLAIR · axial · 5.0mm · 0.43mm/px · 1 of 23 slices shown]
[im 1/23]
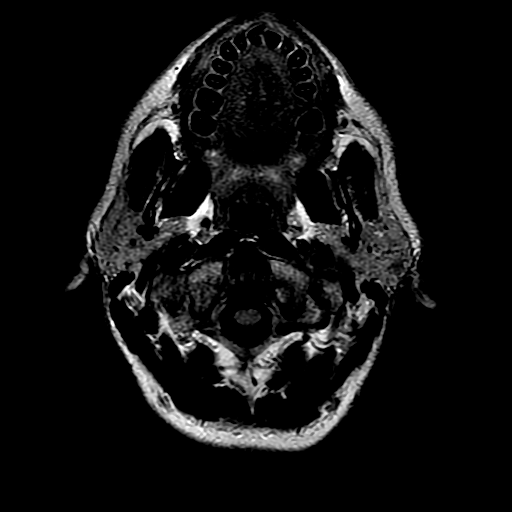

[Series 8: (id) mt fs · axial · 1.4mm · 0.39mm/px · z∈[-6,+142]mm · 5 of 137 slices shown]
[im 1/137]
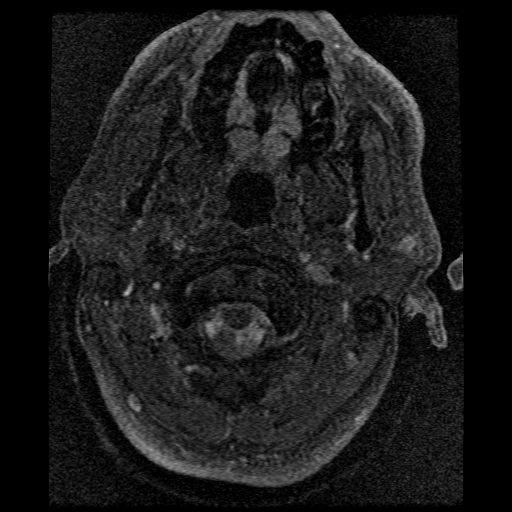
[im 35/137]
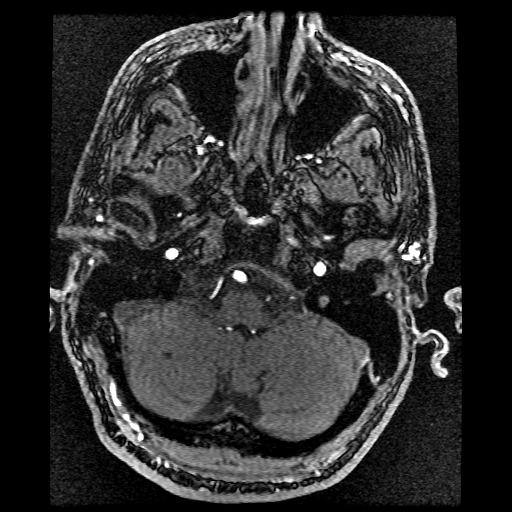
[im 69/137]
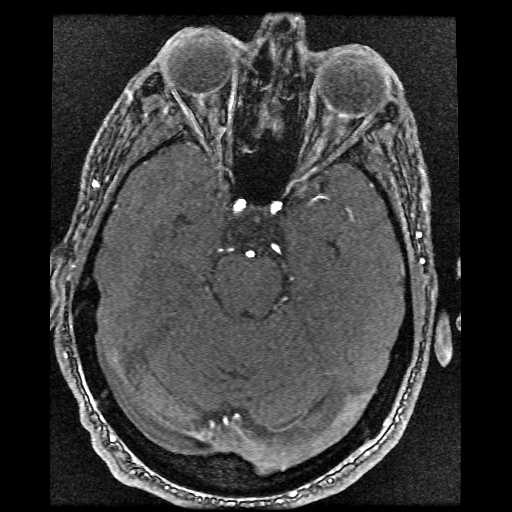
[im 103/137]
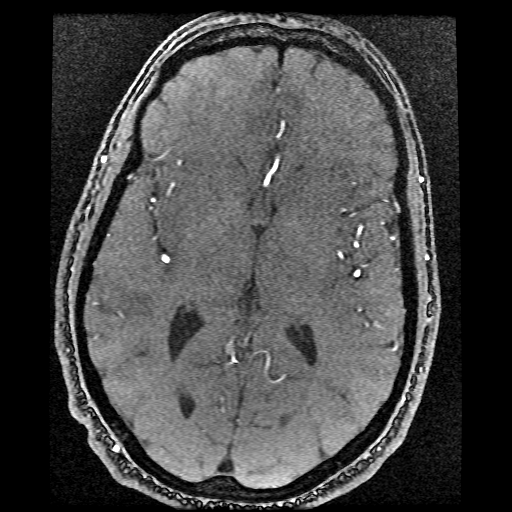
[im 137/137]
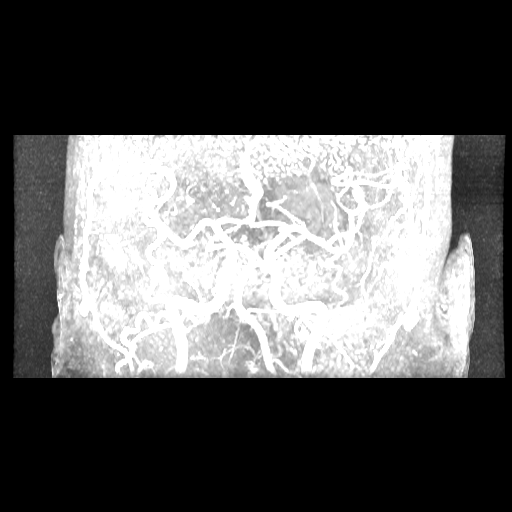

[Series 9: ax mpgr · axial · 5.0mm · 0.43mm/px · 1 of 23 slices shown]
[im 1/23]
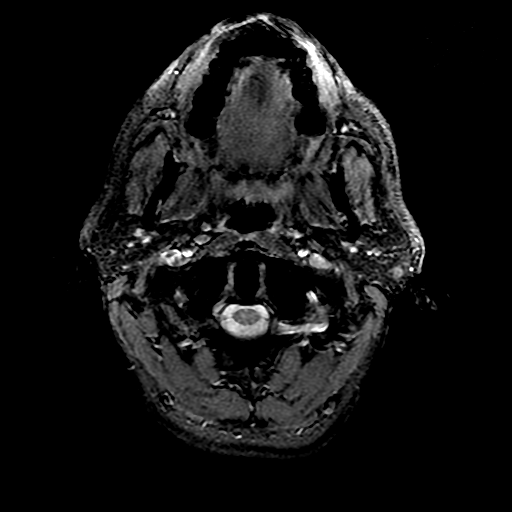

[Series 10: ax fspgr irp · axial · 3.0mm · 0.47mm/px · 1 of 60 slices shown]
[im 1/60]
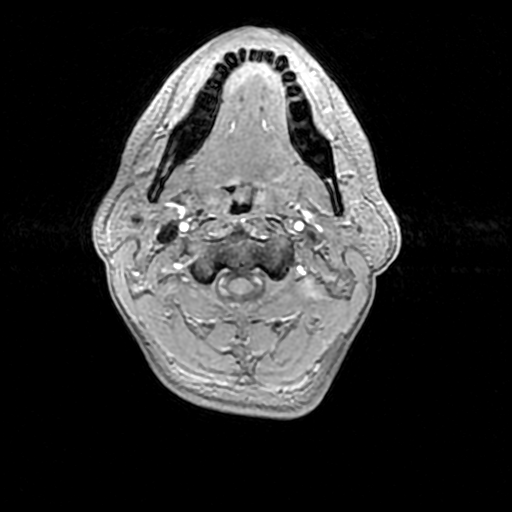

[Series 11: T2 · coronal · 5.0mm · 0.45mm/px · 1 of 26 slices shown (2 of 2)]
[im 1/26]
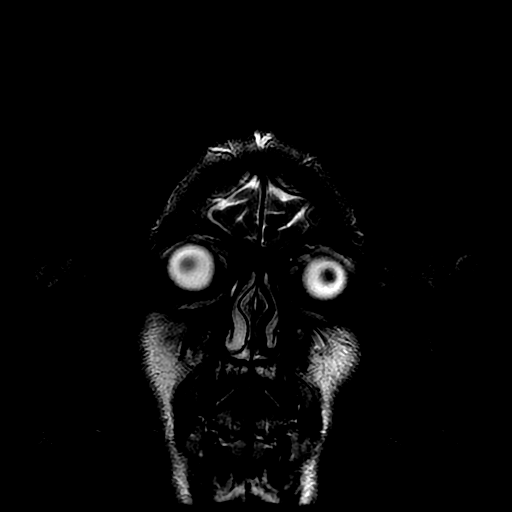

[Series 17: T1 post-contrast · coronal · 5.0mm · 0.45mm/px · 1 of 26 slices shown]
[im 1/26]
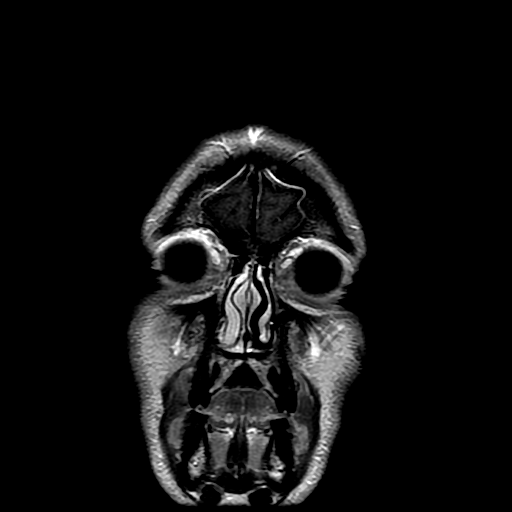

[Series 400: DWI · axial · 3.0mm · 1.09mm/px · z∈[-12,+138]mm · 2 of 51 slices shown (3 of 4)]
[im 1/51]
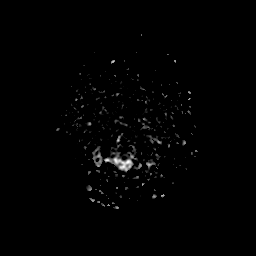
[im 51/51]
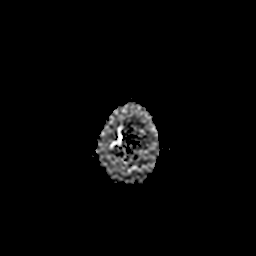

[Series 500: DWI · coronal · 5.0mm · 1.09mm/px · 1 of 35 slices shown (4 of 4)]
[im 1/35]
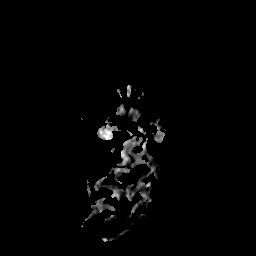

[23 of 48 positions shown; findings below may reference images not displayed]

FINDINGS: MRI HEAD FINDINGS

The cerebral volume within normal limits for patient age. Single
subcentimeter T2/FLAIR hyperintense focus noted within the deep
white matter of the left centrum semi ovale (series 7, image 17), of
doubtful clinical significance. No other focal parenchymal signal
abnormality identified. No significant cerebral white matter disease
for patient age. subcentimeter remote right cerebellar infarct noted
(series 6, image 5), stable from previous.

No abnormal foci of restricted diffusion to suggest acute or
subacute ischemia. Gray-white matter differentiation well
maintained. No foci of susceptibility artifact to suggest acute or
chronic intracranial hemorrhage. No other evidence for chronic
infarction.

No mass lesion, midline shift, or mass effect. Ventricles normal in
size without evidence for hydrocephalus. No extra-axial fluid
collection. Major dural sinuses are grossly patent.

Pituitary gland and suprasellar region within normal limits. Midline
structures intact and normal.

Major intracranial vascular flow voids are well maintained.

Craniocervical junction within normal limits. Visualized upper
cervical spine unremarkable. Bone marrow signal intensity within
normal limits. No scalp soft tissue abnormality.

Globes and orbital soft tissues within normal limits. Chronic right
sphenoid sinus disease again noted. Paranasal sinuses otherwise
clear. Small bilateral mastoid effusions noted, of doubtful
significance.

No abnormal enhancement.

MRA HEAD FINDINGS

ANTERIOR CIRCULATION:

Visualized distal cervical segments of the internal carotid arteries
are widely patent bilaterally with antegrade flow. Petrous,
cavernous, and supraclinoid segments of the internal carotid
arteries are widely patent without flow-limiting stenosis. Origin of
the ophthalmic arteries patent. ICA termini widely patent. A1
segments widely patent. Anterior communicating artery normal.
Anterior cerebral arteries widely patent to their distal aspects. M1
segments patent without stenosis or occlusion. No proximal M2
occlusion. Distal MCA branches well opacified and symmetric.

POSTERIOR CIRCULATION:

Vertebral arteries widely patent to the vertebrobasilar junction.
Left vertebral artery dominant with a diffusely hypoplastic right
vertebral artery. Partially visualized posterior inferior cerebral
arteries are widely patent. Basilar artery mildly tortuous but
widely patent to its distal aspect. No basilar tip stenosis or
aneurysm. Origin of the superior cerebral arteries patent
bilaterally. Both of the posterior cerebral artery supplied via the
basilar artery and are widely patent to their distal aspects.

No aneurysm or vascular malformation.

MRA NECK FINDINGS

Partially visualized aortic arch of normal caliber with normal 3
vessel morphology. No flow-limiting stenosis about the origin of the
great vessels. Visualized subclavian arteries are widely patent.

Right common carotid artery widely patent from its origin to the
bifurcation. No significant atheromatous narrowing about the right
bifurcation and/or proximal right ICA. Right ICA widely patent
distally to the circle of Willis without stenosis or occlusion.

Left common carotid artery widely patent from its origin to the
bifurcation. No significant atheromatous narrowing about the left
bifurcation and/or proximal left ICA. Left ICA widely patent
distally to the circle of Willis without stenosis or occlusion.

Both vertebral arteries arise from the subclavian arteries. Left
vertebral artery dominant and widely patent to the vertebrobasilar
junction. Right vertebral artery diffusely hypoplastic and patent as
well. Origin of the right vertebral artery not well visualized on
this exam.
IMPRESSION: MRI HEAD IMPRESSION:

1. No acute intracranial infarct or other process identified.
2. Small remote right cerebellar infarct.
3. Otherwise normal MRI of the brain.
4. Chronic right sphenoid sinusitis.

MRA HEAD IMPRESSION:

Normal intracranial MRA. No large or proximal arterial branch
occlusion. No high-grade or correctable stenosis. No significant
atheromatous disease for patient age.

MRA NECK IMPRESSION:

1. Normal MRA of the neck. No high-grade or critical stenosis within
the major arterial vasculature of the neck.
2. Diffusely hypoplastic right vertebral artery, a normal anatomic
variant. Dominant left vertebral artery widely patent within the
neck.

## 2018-12-20 ENCOUNTER — Ambulatory Visit: Payer: BLUE CROSS/BLUE SHIELD | Admitting: Orthotics

## 2018-12-21 ENCOUNTER — Ambulatory Visit: Payer: BLUE CROSS/BLUE SHIELD | Admitting: Orthotics

## 2020-01-18 ENCOUNTER — Other Ambulatory Visit: Payer: BLUE CROSS/BLUE SHIELD | Admitting: Orthotics
